# Patient Record
Sex: Female | Born: 1965
Health system: Southern US, Community
[De-identification: ages and names within clinical notes are randomized; demographics above are authoritative.]

## PROBLEM LIST (undated history)

## (undated) DIAGNOSIS — F329 Major depressive disorder, single episode, unspecified: Secondary | ICD-10-CM

## (undated) DIAGNOSIS — F32A Depression, unspecified: Secondary | ICD-10-CM

## (undated) DIAGNOSIS — E119 Type 2 diabetes mellitus without complications: Secondary | ICD-10-CM

## (undated) DIAGNOSIS — I10 Essential (primary) hypertension: Secondary | ICD-10-CM

## (undated) HISTORY — PX: DIAGNOSTIC LAPAROSCOPY: SUR761

## (undated) HISTORY — DX: Essential (primary) hypertension: I10

---

## 1987-12-26 HISTORY — PX: DILATION AND CURETTAGE OF UTERUS: SHX78

## 1998-12-25 HISTORY — PX: OTHER SURGICAL HISTORY: SHX169

## 2001-12-25 LAB — HIV ANTIBODY (ROUTINE TESTING W REFLEX): HIV 1&2 Ab, 4th Generation: NEGATIVE

## 2007-01-17 ENCOUNTER — Ambulatory Visit: Payer: Self-pay | Admitting: Unknown Physician Specialty

## 2008-03-11 ENCOUNTER — Ambulatory Visit: Payer: Self-pay | Admitting: Unknown Physician Specialty

## 2009-03-12 ENCOUNTER — Ambulatory Visit: Payer: Self-pay | Admitting: Unknown Physician Specialty

## 2010-03-16 ENCOUNTER — Other Ambulatory Visit: Payer: Self-pay | Admitting: Physician Assistant

## 2010-03-30 ENCOUNTER — Ambulatory Visit: Payer: Self-pay | Admitting: Unknown Physician Specialty

## 2010-07-15 ENCOUNTER — Ambulatory Visit: Payer: Self-pay | Admitting: General Practice

## 2012-03-27 ENCOUNTER — Encounter: Payer: Self-pay | Admitting: Internal Medicine

## 2012-03-27 ENCOUNTER — Ambulatory Visit (INDEPENDENT_AMBULATORY_CARE_PROVIDER_SITE_OTHER): Payer: PRIVATE HEALTH INSURANCE | Admitting: Internal Medicine

## 2012-03-27 VITALS — BP 136/88 | HR 100 | Temp 98.0°F | Resp 16 | Ht 64.0 in | Wt 273.2 lb

## 2012-03-27 DIAGNOSIS — Z9189 Other specified personal risk factors, not elsewhere classified: Secondary | ICD-10-CM | POA: Insufficient documentation

## 2012-03-27 DIAGNOSIS — I10 Essential (primary) hypertension: Secondary | ICD-10-CM

## 2012-03-27 DIAGNOSIS — E669 Obesity, unspecified: Secondary | ICD-10-CM

## 2012-03-27 DIAGNOSIS — Z1239 Encounter for other screening for malignant neoplasm of breast: Secondary | ICD-10-CM

## 2012-03-27 MED ORDER — LISINOPRIL-HYDROCHLOROTHIAZIDE 10-12.5 MG PO TABS: 1.0000 | ORAL_TABLET | Freq: Every day | ORAL | Status: DC

## 2012-03-27 MED ORDER — FLUOXETINE HCL 20 MG PO CAPS: 20.0000 mg | ORAL_CAPSULE | Freq: Every day | ORAL | Status: DC

## 2012-03-27 NOTE — Assessment & Plan Note (Signed)
I have addressed  BMI and recommended a low glycemic index diet utilizing smaller more frequent meals to increase metabolism.  I have also recommended that patient start exercising with a goal of 30 minutes of aerobic exercise a minimum of 5 days per week. Screening for lipid disorders, thyroid and diabetes to be done today.   

## 2012-03-27 NOTE — Assessment & Plan Note (Signed)
Aggravated by obesity.  Resuming lisinopril/hctz.  Repeat BMET one week

## 2012-03-27 NOTE — Progress Notes (Signed)
Patient ID: Laura Gray, female   DOB: Nov 29, 1966, 46 y.o.   MRN: [id number]  Patient Active Problem List  Diagnoses  . Hypertension  . Obesity (BMI 30-39.9)  . DES exposure in utero    Subjective:  CC:   Chief Complaint  Patient presents with  . New Patient    HPI:   Laura Gray a 46 y.o. female who presents   Who is here to establish primary care.  She is an ER nurse x 15 yrs.  Has had blood pressure issues x 2 yrs, treated in the past by [facility]  's Dr. Terance Hart for one year bc of headaches accompanying rhe bp issues.  Took lisinopril/hct ,  With weght loss the bp imporved so meds were stopped and headaches returned and she was put on propranolol for the headaches whic made her miserable.  Has been off of meds for one month.  Having headaches about once or twice week,  Occipital/neck and over left eye.  Up to date on eye exams.,  Carries tension in her neck .  Employee labs in October were normal except for cholesterol .  Has history of DES exposure,  recieves quad PAPs for cervicla Ca screening, last one 2011 by Ar[facility].    Past Medical History  Diagnosis Date  . Hypertension     Past Surgical History  Procedure Date  . Dilation and curettage of uterus 1989    miscarriage  . Exploratory 2000    x 2, ectopic pregnancy x 2, one rupture         The following portions of the patient's history were reviewed and updated as appropriate: Allergies, current medications, and problem list.    Review of Systems:   12 Pt  review of systems was negative except those addressed in the HPI,     History   Social History  . Marital Status: Divorced    Spouse Name: N/A    Number of Children: N/A  . Years of Education: N/A   Occupational History  . Not on file.   Social History Main Topics  . Smoking status: Never Smoker   . Smokeless tobacco: Never Used  . Alcohol Use: 0.6 oz/week    1 Glasses of wine per week  . Drug Use: No  . Sexually Active: Not on file     Other Topics Concern  . Not on file   Social History Narrative  . No narrative on file    Objective:  BP 136/88  Pulse 100  Temp(Src) 98 F (36.7 C) (Oral)  Resp 16  Ht 5\' 4"  (1.626 m)  Wt 273 lb 4 oz (123.945 kg)  BMI 46.90 kg/m2  SpO2 97%  LMP 01/28/2012  General appearance: alert, cooperative and appears stated age Ears: normal TM's and external ear canals both ears Throat: lips, mucosa, and tongue normal; teeth and gums normal Neck: no adenopathy, no carotid bruit, supple, symmetrical, trachea midline and thyroid not enlarged, symmetric, no tenderness/mass/nodules Back: symmetric, no curvature. ROM normal. No CVA tenderness. Lungs: clear to auscultation bilaterally Heart: regular rate and rhythm, S1, S2 normal, no murmur, click, rub or gallop Abdomen: soft, non-tender; bowel sounds normal; no masses,  no organomegaly Pulses: 2+ and symmetric Skin: Skin color, texture, turgor normal. No rashes or lesions Lymph nodes: Cervical, supraclavicular, and axillary nodes normal.  Assessment and Plan:  Hypertension Aggravated by obesity.  Resuming lisinopril/hctz.  Repeat BMET one week  Obesity (BMI 30-39.9) I have addressed  BMI and  recommended a low glycemic index diet utilizing smaller more frequent meals to increase metabolism.  I have also recommended that patient start exercising with a goal of 30 minutes of aerobic exercise a minimum of 5 days per week. Screening for lipid disorders, thyroid and diabetes to be done today.    DES exposure in utero Awaiting records from GYN,  Will need PAP in 6 months    Updated Medication List Outpatient Encounter Prescriptions as of 03/27/2012  Medication Sig Dispense Refill  . FLUoxetine (PROZAC) 20 MG capsule Take 1 capsule (20 mg total) by mouth daily. Take an additional dose on all weekend days  Keep on file for future refills  38 capsule  11  . lisinopril-hydrochlorothiazide (PRINZIDE,ZESTORETIC) 10-12.5 MG per tablet Take  1 tablet by mouth daily.  90 tablet  3  . Multiple Vitamin (MULTIVITAMIN) tablet Take 1 tablet by mouth daily.      Marland Kitchen DISCONTD: FLUoxetine (PROZAC) 20 MG capsule Take 20 mg by mouth daily.         Orders Placed This Encounter  Procedures  . MM Digital Screening    No Follow-up on file.

## 2012-03-27 NOTE — Assessment & Plan Note (Signed)
Awaiting records from GYN,  Will need PAP in 6 months

## 2012-04-30 ENCOUNTER — Ambulatory Visit: Payer: Self-pay | Admitting: Internal Medicine

## 2012-04-30 LAB — HM MAMMOGRAPHY: HM Mammogram: NORMAL

## 2012-05-07 ENCOUNTER — Telehealth: Payer: Self-pay | Admitting: Internal Medicine

## 2012-05-07 ENCOUNTER — Encounter: Payer: Self-pay | Admitting: Internal Medicine

## 2012-05-07 NOTE — Telephone Encounter (Signed)
Patient notified her mammogram is normal.

## 2012-05-10 ENCOUNTER — Encounter: Payer: Self-pay | Admitting: Internal Medicine

## 2012-09-26 ENCOUNTER — Ambulatory Visit (INDEPENDENT_AMBULATORY_CARE_PROVIDER_SITE_OTHER): Payer: PRIVATE HEALTH INSURANCE | Admitting: Internal Medicine

## 2012-09-26 ENCOUNTER — Encounter: Payer: Self-pay | Admitting: Internal Medicine

## 2012-09-26 ENCOUNTER — Other Ambulatory Visit (HOSPITAL_COMMUNITY)
Admission: RE | Admit: 2012-09-26 | Discharge: 2012-09-26 | Disposition: A | Discharge status: Home / Self Care | Payer: PRIVATE HEALTH INSURANCE | Source: Ambulatory Visit | Attending: Internal Medicine | Admitting: Internal Medicine

## 2012-09-26 VITALS — BP 108/68 | HR 73 | Temp 98.3°F | Ht 64.0 in | Wt 269.0 lb

## 2012-09-26 DIAGNOSIS — Z01419 Encounter for gynecological examination (general) (routine) without abnormal findings: Secondary | ICD-10-CM | POA: Insufficient documentation

## 2012-09-26 DIAGNOSIS — Z1151 Encounter for screening for human papillomavirus (HPV): Secondary | ICD-10-CM | POA: Insufficient documentation

## 2012-09-26 DIAGNOSIS — Z0000 Encounter for general adult medical examination without abnormal findings: Secondary | ICD-10-CM | POA: Insufficient documentation

## 2012-09-26 LAB — HM PAP SMEAR: HM Pap smear: NORMAL

## 2012-09-26 NOTE — Progress Notes (Signed)
Patient ID: Laura Gray, female   DOB: 1966/03/13, 46 y.o.   MRN: [id number] Subjective:     Laura Gray is a 46 y.o. female here for a routine exam.  Current complaints: irregular periods.  Personal health questionnaire reviewed: no.   Gynecologic History Patient's last menstrual period was 08/21/2012. Contraception: none Last Pap: 2 years ago. Results were: normal Last mammogram: May 2013 . Results were: normal  Obstetric History OB History    Grav Para Term Preterm Abortions TAB SAB Ect Mult Living                   The following portions of the patient's history were reviewed and updated as appropriate: allergies, current medications, past family history, past medical history, past social history, past surgical history and problem list.  Review of Systems A comprehensive review of systems was negative.    Objective:    BP 108/68  Pulse 73  Temp 98.3 F (36.8 C) (Oral)  Ht 5\' 4"  (1.626 m)  Wt 269 lb (122.018 kg)  BMI 46.17 kg/m2  SpO2 97%  LMP 08/21/2012  General Appearance:    Alert, cooperative, no distress, appears stated age  Head:    Normocephalic, without obvious abnormality, atraumatic  Eyes:    PERRL, conjunctiva/corneas clear, EOM's intact, fundi    benign, both eyes  Ears:    Normal TM's and external ear canals, both ears  Nose:   Nares normal, septum midline, mucosa normal, no drainage    or sinus tenderness  Throat:   Lips, mucosa, and tongue normal; teeth and gums normal  Neck:   Supple, symmetrical, trachea midline, no adenopathy;    thyroid:  no enlargement/tenderness/nodules; no carotid   bruit or JVD  Back:     Symmetric, no curvature, ROM normal, no CVA tenderness  Lungs:     Clear to auscultation bilaterally, respirations unlabored  Chest Wall:    No tenderness or deformity   Heart:    Regular rate and rhythm, S1 and S2 normal, no murmur, rub   or gallop  Breast Exam:    No tenderness, masses, or nipple abnormality  Abdomen:     Soft,  non-tender, bowel sounds active all four quadrants,    no masses, no organomegaly  Genitalia:    Normal female without lesion, discharge or tenderness     Extremities:   Extremities normal, atraumatic, no cyanosis or edema  Pulses:   2+ and symmetric all extremities  Skin:   Skin color, texture, turgor normal, no rashes or lesions  Lymph nodes:   Cervical, supraclavicular, and axillary nodes normal  Neurologic:   CNII-XII intact, normal strength, sensation and reflexes    throughout      Assessment:    Healthy female exam.    Plan:    Education reviewed: low fat, low cholesterol diet.

## 2012-09-27 NOTE — Assessment & Plan Note (Signed)
Annual pelvic was done due to history of DES exposure in utero.

## 2013-01-31 ENCOUNTER — Emergency Department: Payer: Self-pay | Admitting: Emergency Medicine

## 2013-06-04 ENCOUNTER — Other Ambulatory Visit: Payer: Self-pay | Admitting: *Deleted

## 2013-06-04 DIAGNOSIS — I10 Essential (primary) hypertension: Secondary | ICD-10-CM

## 2013-06-04 NOTE — Telephone Encounter (Signed)
Does she need followup or lab work prior to refill.? Last visit 10/13.

## 2013-06-05 MED ORDER — LISINOPRIL-HYDROCHLOROTHIAZIDE 10-12.5 MG PO TABS: 1.0000 | ORAL_TABLET | Freq: Every day | ORAL | Status: DC

## 2013-06-05 NOTE — Telephone Encounter (Signed)
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

## 2013-06-05 NOTE — Telephone Encounter (Signed)
Left message for pt to return my call.

## 2013-06-05 NOTE — Telephone Encounter (Signed)
OV and labwork appointment scheduled. Rx escripted to pharmacy for 1 month refill. Any other labs besides CBC, CMET, lipid panel, TSH and I will order them.

## 2013-06-16 ENCOUNTER — Telehealth: Payer: Self-pay | Admitting: *Deleted

## 2013-06-16 DIAGNOSIS — E669 Obesity, unspecified: Secondary | ICD-10-CM

## 2013-06-16 DIAGNOSIS — R5383 Other fatigue: Secondary | ICD-10-CM

## 2013-06-16 NOTE — Telephone Encounter (Signed)
Pt is coming in for labs tomorrow, what labs and dx?  

## 2013-06-17 ENCOUNTER — Other Ambulatory Visit (INDEPENDENT_AMBULATORY_CARE_PROVIDER_SITE_OTHER): Payer: 59

## 2013-06-17 DIAGNOSIS — R5381 Other malaise: Secondary | ICD-10-CM

## 2013-06-17 DIAGNOSIS — E669 Obesity, unspecified: Secondary | ICD-10-CM

## 2013-06-17 LAB — CBC WITH DIFFERENTIAL/PLATELET
Basophils Relative: 0.7 % (ref 0.0–3.0)
Eosinophils Absolute: 0.1 10*3/uL (ref 0.0–0.7)
Lymphocytes Relative: 36.3 % (ref 12.0–46.0)
MCHC: 34.7 g/dL (ref 30.0–36.0)
Monocytes Relative: 6.3 % (ref 3.0–12.0)
Neutrophils Relative %: 55.6 % (ref 43.0–77.0)
RBC: 4.24 Mil/uL (ref 3.87–5.11)
WBC: 7.3 10*3/uL (ref 4.5–10.5)

## 2013-06-17 LAB — COMPREHENSIVE METABOLIC PANEL
Albumin: 3.7 g/dL (ref 3.5–5.2)
BUN: 16 mg/dL (ref 6–23)
CO2: 27 mEq/L (ref 19–32)
Calcium: 9.2 mg/dL (ref 8.4–10.5)
Chloride: 102 mEq/L (ref 96–112)
Creatinine, Ser: 0.9 mg/dL (ref 0.4–1.2)
GFR: 71.31 mL/min (ref >60.00)
Glucose, Bld: 128 mg/dL — ABNORMAL HIGH (ref 70–99)
Potassium: 3.7 mEq/L (ref 3.5–5.1)

## 2013-06-17 LAB — LIPID PANEL
Cholesterol: 223 mg/dL — ABNORMAL HIGH (ref 0–200)
HDL: 55.6 mg/dL (ref >39.00)
Total CHOL/HDL Ratio: 4
Triglycerides: 145 mg/dL (ref 0.0–149.0)

## 2013-06-17 NOTE — Telephone Encounter (Signed)
Fasting labs ordered

## 2013-06-18 ENCOUNTER — Ambulatory Visit (INDEPENDENT_AMBULATORY_CARE_PROVIDER_SITE_OTHER): Payer: 59 | Admitting: Internal Medicine

## 2013-06-18 ENCOUNTER — Encounter: Payer: Self-pay | Admitting: Internal Medicine

## 2013-06-18 VITALS — BP 108/78 | HR 89 | Temp 98.4°F | Resp 14 | Wt 286.5 lb

## 2013-06-18 DIAGNOSIS — I10 Essential (primary) hypertension: Secondary | ICD-10-CM

## 2013-06-18 DIAGNOSIS — E669 Obesity, unspecified: Secondary | ICD-10-CM

## 2013-06-18 DIAGNOSIS — F411 Generalized anxiety disorder: Secondary | ICD-10-CM

## 2013-06-18 MED ORDER — LISINOPRIL-HYDROCHLOROTHIAZIDE 10-12.5 MG PO TABS: 1.0000 | ORAL_TABLET | Freq: Every day | ORAL | Status: DC

## 2013-06-18 MED ORDER — FLUOXETINE HCL 20 MG PO CAPS: ORAL_CAPSULE | ORAL | Status: DC

## 2013-06-18 NOTE — Patient Instructions (Addendum)
Your fasting glucose was 128.  This means you technically have DM type 2.  I want you to lose 12 lbs (minimum) beofre your 3 month follo wup   This is Dr Melina Schools version of a  "Low GI"  Diet:  It will  lower your blood sugars and allow you to lose 4 to 8  lbs  per month if you follow it carefully.  Your goal with exercise is a minimum of 30 minutes of aerobic exercise 5 days per week (Walking does not count once it becomes easy!)    All of the foods can be found at grocery stores and in bulk at [employer].  The Atkins protein bars and shakes are available in more varieties at Target, WalMart and [employer] Foods.     7 AM Breakfast:  Choose from the following:  Low carbohydrate Protein  Shakes (I recommend the EAS AdvantEdge "Carb Control" shakes  Or the low carb shakes by Atkins.    2.5 carbs   Arnold's "Sandwhich Thin"toasted  w/ peanut butter (no jelly: about 20 net carbs  "Bagel Thin" with cream cheese and salmon: about 20 carbs   a scrambled egg/bacon/cheese burrito made with Mission's "carb balance" whole wheat tortilla  (about 10 net carbs )   Avoid cereal and bananas, oatmeal and cream of wheat and grits. They are loaded with carbohydrates!   10 AM: high protein snack  Protein bar by Atkins (the snack size, under 200 cal, usually < 6 net carbs).    A stick of cheese:  Around 1 carb,  100 cal     Dannon Light n Fit [place] Yogurt  (80 cal, 8 carbs)  Other so called "protein bars" and Greek yogurts tend to be loaded with carbohydrates.  Remember, in food advertising, the word "energy" is synonymous for " carbohydrate."  Lunch:   A Sandwich using the bread choices listed, Can use any  Eggs,  lunchmeat, grilled meat or canned tuna), avocado, regular mayo/mustard  and cheese.  A Salad using blue cheese, ranch,  Goddess or vinagrette,  No croutons or "confetti" and no "candied nuts" but regular nuts OK.   No pretzels or chips.  Pickles and miniature sweet peppers are a good low carb alternative  that provide a "crunch"  The bread is the only source of carbohydrate in a sandwich and  can be decreased by trying some of these alternatives to traditional loaf bread  Joseph's makes a pita bread and a flat bread that are 50 cal and 4 net carbs available at BJs and WalMart.  This can be toasted to use with hummous as well  Toufayan makes a low carb flatbread that's 100 cal and 9 net carbs available at [employer] and [employer] makes 2 sizes of  Low carb whole wheat tortilla  (The large one is 210 cal and 6 net carbs) Avoid "Low fat dressings, as well as Reyne Dumas and [address] dressings They are loaded with sugar!   3 PM/ Mid day  Snack:  Consider  1 ounce of  almonds, walnuts, pistachios, pecans, peanuts,  Macadamia nuts or a nut medley.  Avoid "granola"; the dried cranberries and raisins are loaded with carbohydrates. Mixed nuts as long as there are no raisins,  cranberries or dried fruit.     6 PM  Dinner:     Meat/fowl/fish with a green salad, and either broccoli, cauliflower, green beans, spinach, brussel sprouts or  Lima beans. DO NOT BREAD THE  PROTEIN!!      There is a low carb pasta by Dreamfield's that is acceptable and tastes great: only 5 digestible carbs/serving.( All grocery stores but BJs carry it )  Try Kai Levins Angelo's chicken piccata or chicken or eggplant parm over low carb pasta.(Lowes and BJs)   Clifton Custard Sanchez's "Carnitas" (pulled pork, no sauce,  0 carbs) or his beef pot roast to make a dinner burrito (at BJ's)  Pesto over low carb pasta (bj's sells a good quality pesto in the center refrigerated section of the deli   Whole wheat pasta is still full of digestible carbs and  Not as low in glycemic index as Dreamfield's.   Brown rice is still rice,  So skip the rice and noodles if you eat [place] or [place] (or at least limit to 1/2 cup)  9 PM snack :   Breyer's "low carb" fudgsicle or  ice cream bar (Carb Smart line), or  Weight Watcher's ice cream bar , or another  "no sugar added" ice cream;  a serving of fresh berries/cherries with whipped cream   Cheese or DANNON'S LlGHT N FIT [place] YOGURT  Avoid bananas, pineapple, grapes  and watermelon on a regular basis because they are high in sugar.  THINK OF THEM AS DESSERT  Remember that snack Substitutions should be less than 10 NET carbs per serving and meals < 20 carbs. Remember to subtract fiber grams to get the "net carbs."  Return in 3 months for A1c and fasting labs

## 2013-06-18 NOTE — Progress Notes (Addendum)
Patient ID: Laura Gray, female   DOB: 03/17/1966, 47 y.o.   MRN: [id number]  Patient Active Problem List   Diagnosis Date Noted  . Generalized anxiety disorder 06/19/2013  . Routine general medical examination at a health care facility 09/26/2012  . Hypertension 03/27/2012  . Obesity (BMI 30-39.9) 03/27/2012  . DES exposure in utero 03/27/2012    Subjective:  CC:   Chief Complaint  Patient presents with  . Follow-up    8 months last ov    HPI:   Laura Gray a 47 y.o. female who presents 6 month follow up on chronic conditions including hypertension obesity and hyperlipidemia.   1) She has been out of work after sustaining a right ankle fracture, nondisplace spiral  Treated with casting, no surgery, in early Feb .   Was off feet and out of work for 10 weeks on [employer] it has healed. Some swelling noted still but no apin .   2) new onset back pain.  Muscle spasm two weeks ago in lower back.  Took a massage which helped. Problem was on the right side,   3) Obesity,  Gained 17 LBS since last visit bc of inactivity    4) anger /anxiety, the wellbutrin aggravated her symptoms.    4) amenorrhea, secondary for the past 6 months, has resolved with her first meses which occurred last week.  Not particularly heavy or painful.    Past Medical History  Diagnosis Date  . Hypertension     Past Surgical History  Procedure Laterality Date  . Dilation and curettage of uterus  1989    miscarriage  . Exploratory  2000    x 2, ectopic pregnancy x 2, one rupture       The following portions of the patient's history were reviewed and updated as appropriate: Allergies, current medications, and problem list.    Review of Systems:   12 Pt  review of systems was negative except those addressed in the HPI,     History   Social History  . Marital Status: Divorced    Spouse Name: N/A    Number of Children: N/A  . Years of Education: N/A   Occupational History   . Not on file.   Social History Main Topics  . Smoking status: Never Smoker   . Smokeless tobacco: Never Used  . Alcohol Use: 0.6 oz/week    1 Glasses of wine per week  . Drug Use: No  . Sexually Active: Not on file   Other Topics Concern  . Not on file   Social History Narrative  . No narrative on file    Objective:  BP 108/78  Pulse 89  Temp(Src) 98.4 F (36.9 C) (Oral)  Resp 14  Wt 286 lb 8 oz (129.956 kg)  BMI 49.15 kg/m2  SpO2 99%  LMP 06/12/2013  General appearance: alert, cooperative and appears stated age Ears: normal TM's and external ear canals both ears Throat: lips, mucosa, and tongue normal; teeth and gums normal Neck: no adenopathy, no carotid bruit, supple, symmetrical, trachea midline and thyroid not enlarged, symmetric, no tenderness/mass/nodules Back: symmetric, no curvature. ROM normal. No CVA tenderness. Lungs: clear to auscultation bilaterally Heart: regular rate and rhythm, S1, S2 normal, no murmur, click, rub or gallop Abdomen: soft, non-tender; bowel sounds normal; no masses,  no organomegaly Pulses: 2+ and symmetric Skin: Skin color, texture, turgor normal. No rashes or lesions Lymph nodes: Cervical, supraclavicular, and axillary nodes normal.  Assessment and Plan:  Generalized anxiety disorder Better controled with Prozac.  No changes today   Hypertension Well controlled on current regimen. Renal function stable, no changes today.  Obesity (BMI 30-39.9) I have addressed  BMI and recommended a low glycemic index diet utilizing smaller more frequent meals to increase metabolism.  I have also recommended that patient start exercising with a goal of 30 minutes of aerobic exercise a minimum of 5 days per week. Screening for lipid disorders, thyroid and diabetes today notes a fasting glucose of 128 in the setting of weight gain and inaitvity.  Will repeat in 3 to 6 months with an a1c .   A total of 40 minutes was spent with patient more than  half of which was spent in counseling, reviewing records from other prviders and coordination of care.   Updated Medication List Outpatient Encounter Prescriptions as of 06/18/2013  Medication Sig Dispense Refill  . cetirizine (ZYRTEC) 10 MG tablet Take 10 mg by mouth daily.      Marland Kitchen FLUoxetine (PROZAC) 20 MG capsule Take an additional dose on all weekend days  114 capsule  3  . lisinopril-hydrochlorothiazide (PRINZIDE,ZESTORETIC) 10-12.5 MG per tablet Take 1 tablet by mouth daily.  90 tablet  3  . Multiple Vitamin (MULTIVITAMIN) tablet Take 1 tablet by mouth daily.      . [DISCONTINUED] FLUoxetine (PROZAC) 20 MG capsule Take 1 capsule (20 mg total) by mouth daily. Take an additional dose on all weekend days  Keep on file for future refills  38 capsule  11  . [DISCONTINUED] lisinopril-hydrochlorothiazide (PRINZIDE,ZESTORETIC) 10-12.5 MG per tablet Take 1 tablet by mouth daily.  30 tablet  0   No facility-administered encounter medications on file as of 06/18/2013.     No orders of the defined types were placed in this encounter.    No Follow-up on file.

## 2013-06-19 DIAGNOSIS — F411 Generalized anxiety disorder: Secondary | ICD-10-CM | POA: Insufficient documentation

## 2013-06-19 NOTE — Assessment & Plan Note (Signed)
Better controled with Prozac.  No changes today

## 2013-06-19 NOTE — Assessment & Plan Note (Signed)
I have addressed  BMI and recommended a low glycemic index diet utilizing smaller more frequent meals to increase metabolism.  I have also recommended that patient start exercising with a goal of 30 minutes of aerobic exercise a minimum of 5 days per week. Screening for lipid disorders, thyroid and diabetes today notes a fasting glucose of 128 in the setting of weight gain and inaitvity.  Will repeat in 3 to 6 months with an a1c .

## 2013-06-19 NOTE — Assessment & Plan Note (Signed)
Well controlled on current regimen. Renal function stable, no changes today. 

## 2013-09-11 ENCOUNTER — Encounter: Payer: Self-pay | Admitting: Internal Medicine

## 2013-09-18 ENCOUNTER — Encounter: Payer: Self-pay | Admitting: Internal Medicine

## 2013-09-18 ENCOUNTER — Ambulatory Visit (INDEPENDENT_AMBULATORY_CARE_PROVIDER_SITE_OTHER): Payer: 59 | Admitting: Internal Medicine

## 2013-09-18 VITALS — BP 118/80 | HR 74 | Temp 98.7°F | Resp 14 | Ht 64.0 in | Wt 264.2 lb

## 2013-09-18 DIAGNOSIS — R739 Hyperglycemia, unspecified: Secondary | ICD-10-CM

## 2013-09-18 DIAGNOSIS — E785 Hyperlipidemia, unspecified: Secondary | ICD-10-CM

## 2013-09-18 DIAGNOSIS — IMO0001 Reserved for inherently not codable concepts without codable children: Secondary | ICD-10-CM

## 2013-09-18 DIAGNOSIS — E119 Type 2 diabetes mellitus without complications: Secondary | ICD-10-CM

## 2013-09-18 DIAGNOSIS — N926 Irregular menstruation, unspecified: Secondary | ICD-10-CM

## 2013-09-18 DIAGNOSIS — E669 Obesity, unspecified: Secondary | ICD-10-CM

## 2013-09-18 DIAGNOSIS — Z1239 Encounter for other screening for malignant neoplasm of breast: Secondary | ICD-10-CM

## 2013-09-18 DIAGNOSIS — S82891D Other fracture of right lower leg, subsequent encounter for closed fracture with routine healing: Secondary | ICD-10-CM

## 2013-09-18 DIAGNOSIS — R7309 Other abnormal glucose: Secondary | ICD-10-CM

## 2013-09-18 DIAGNOSIS — I10 Essential (primary) hypertension: Secondary | ICD-10-CM

## 2013-09-18 DIAGNOSIS — Z0000 Encounter for general adult medical examination without abnormal findings: Secondary | ICD-10-CM

## 2013-09-18 LAB — COMPREHENSIVE METABOLIC PANEL
Albumin: 4.1 g/dL (ref 3.5–5.2)
BUN: 15 mg/dL (ref 6–23)
Calcium: 9.7 mg/dL (ref 8.4–10.5)
Chloride: 102 mEq/L (ref 96–112)
Glucose, Bld: 103 mg/dL — ABNORMAL HIGH (ref 70–99)
Potassium: 3.7 mEq/L (ref 3.5–5.1)
Total Bilirubin: 0.6 mg/dL (ref 0.3–1.2)
Total Protein: 7.9 g/dL (ref 6.0–8.3)

## 2013-09-18 LAB — MICROALBUMIN / CREATININE URINE RATIO
Microalb Creat Ratio: 0.2 mg/g (ref 0.0–30.0)
Microalb, Ur: 0.3 mg/dL (ref 0.0–1.9)

## 2013-09-18 LAB — LIPID PANEL
Cholesterol: 218 mg/dL — ABNORMAL HIGH (ref 0–200)
HDL: 55.9 mg/dL (ref >39.00)
Total CHOL/HDL Ratio: 4
VLDL: 21.4 mg/dL (ref 0.0–40.0)

## 2013-09-18 LAB — LDL CHOLESTEROL, DIRECT: Direct LDL: 145.6 mg/dL

## 2013-09-18 NOTE — Progress Notes (Signed)
Patient ID: Laura Gray, female   DOB: [birth year], 47 y.o.   MRN: [id number]  Patient Active Problem List   Diagnosis Date Noted  . Other and unspecified hyperlipidemia 09/20/2013  . Generalized anxiety disorder 06/19/2013  . Routine general medical examination at a health care facility 09/26/2012  . Hypertension 03/27/2012  . Obesity (BMI 30-39.9) 03/27/2012  . DES exposure in utero 03/27/2012    Subjective:  CC:   Chief Complaint  Patient presents with  . Follow-up    3 month    HPI:   Laura Gray is a 47 y.o. female who presents for 3 month follow up on hypertension, elevated fasting glucose of 128  and obesity. She has lost  22 lbs on low GI diet.  Her blood pressure has been well controlled. She has been having menstrual irregularity.  She had 3 months  of normal menses,  June throuhg August, then none this month.   She is not sexually active.  No hot flashes.    Past Medical History  Diagnosis Date  . Hypertension     Past Surgical History  Procedure Laterality Date  . Dilation and curettage of uterus  1989    miscarriage  . Exploratory  2000    x 2, ectopic pregnancy x 2, one rupture    The following portions of the patient's history were reviewed and updated as appropriate: Allergies, current medications, and problem list.    Review of Systems:   12 Pt  review of systems was negative except those addressed in the HPI,     History   Social History  . Marital Status: Divorced    Spouse Name: N/A    Number of Children: N/A  . Years of Education: N/A   Occupational History  . Not on file.   Social History Main Topics  . Smoking status: Never Smoker   . Smokeless tobacco: Never Used  . Alcohol Use: 0.6 oz/week    1 Glasses of wine per week  . Drug Use: No  . Sexual Activity: Yes   Other Topics Concern  . Not on file   Social History Narrative  . No narrative on file    Objective:  Filed Vitals:   09/18/13 1042  BP: 118/80  Pulse: 74   Temp: 98.7 F (37.1 C)  Resp: 14     General appearance: alert, cooperative and appears stated age Neck: no adenopathy, no carotid bruit, supple, symmetrical, trachea midline and thyroid not enlarged, symmetric, no tenderness/mass/nodules Back: symmetric, no curvature. ROM normal. No CVA tenderness. Lungs: clear to auscultation bilaterally Heart: regular rate and rhythm, S1, S2 normal, no murmur, click, rub or gallop Abdomen: soft, non-tender; bowel sounds normal; no masses,  no organomegaly Pulses: 2+ and symmetric Skin: Skin color, texture, turgor normal. No rashes or lesions Lymph nodes: Cervical, supraclavicular, and axillary nodes normal.  Assessment and Plan:  Obesity (BMI 30-39.9) She has lost 17 lbs in the last 3 months following the low GI diet I gave her. I have congratulated her and  Encouraged her to lose another  10% of body weight over the next 6 months using the low glycemic index diet and regular exercise a minimum of 5 days per week.  She had a fasting glucose of 128 3 months ago, but her A1c is 5.2 and repeat FBG is 103 with wt loss and diet.    Other and unspecified hyperlipidemia LDL is above goal.  Statin initiation was recommended but she would like  to give diet another 6 months and repeat labs.   Hypertension Well controlled on current regimen. Renal function stable, no changes today.  Irregular menstrual cycle Etiology may be perimenopause vs PCOS.  She is not sexually active so pregnancy unlikely.  Without hot flashes or other sifns of menopause.  NO workup at this time.   Screening for breast cancer Mammogram ordered/    Updated Medication List Outpatient Encounter Prescriptions as of 09/18/2013  Medication Sig Dispense Refill  . cetirizine (ZYRTEC) 10 MG tablet Take 10 mg by mouth daily.      Marland Kitchen FLUoxetine (PROZAC) 20 MG capsule Take an additional dose on all weekend days  114 capsule  3  . lisinopril-hydrochlorothiazide (PRINZIDE,ZESTORETIC)  10-12.5 MG per tablet Take 1 tablet by mouth daily.  90 tablet  3  . Multiple Vitamin (MULTIVITAMIN) tablet Take 1 tablet by mouth daily.       No facility-administered encounter medications on file as of 09/18/2013.

## 2013-09-18 NOTE — Patient Instructions (Addendum)
You are doing great on the weight loss!  We are screening you for DM today since your fasting glucose was 128 3 months ago

## 2013-09-19 ENCOUNTER — Encounter: Payer: Self-pay | Admitting: Internal Medicine

## 2013-09-20 ENCOUNTER — Encounter: Payer: Self-pay | Admitting: Internal Medicine

## 2013-09-20 DIAGNOSIS — Z1239 Encounter for other screening for malignant neoplasm of breast: Secondary | ICD-10-CM | POA: Insufficient documentation

## 2013-09-20 DIAGNOSIS — E785 Hyperlipidemia, unspecified: Secondary | ICD-10-CM | POA: Insufficient documentation

## 2013-09-20 DIAGNOSIS — N911 Secondary amenorrhea: Secondary | ICD-10-CM | POA: Insufficient documentation

## 2013-09-20 DIAGNOSIS — S82891A Other fracture of right lower leg, initial encounter for closed fracture: Secondary | ICD-10-CM | POA: Insufficient documentation

## 2013-09-20 NOTE — Assessment & Plan Note (Signed)
Well controlled on current regimen. Renal function stable, no changes today. 

## 2013-09-20 NOTE — Assessment & Plan Note (Signed)
Mammogram ordered

## 2013-09-20 NOTE — Assessment & Plan Note (Signed)
Etiology may be perimenopauase vs PCOS.  She is not sexually active so pregnancy unlikely.  Without hot flashes or other sifns of menopause.  NO workup at this time.

## 2013-09-20 NOTE — Assessment & Plan Note (Signed)
LDL is above goal.  Statin initiation was recommended but she would like to give diet another 6 months and repeat labs.

## 2013-09-20 NOTE — Assessment & Plan Note (Addendum)
She has lost 17 lbs in the last 3 months following the low GI diet I gave her. I have congratulated her and  Encouraged her to lose another  10% of body weight over the next 6 months using the low glycemic index diet and regular exercise a minimum of 5 days per week.  She had a fasting glucose of 128 3 months ago, but her A1c is 5.2 and repeat FBG is 103 with wt loss and diet.

## 2013-10-01 LAB — HM MAMMOGRAPHY: HM Mammogram: NORMAL

## 2013-10-09 ENCOUNTER — Encounter: Payer: Self-pay | Admitting: Internal Medicine

## 2013-10-09 ENCOUNTER — Encounter: Payer: Self-pay | Admitting: Emergency Medicine

## 2013-10-21 ENCOUNTER — Ambulatory Visit: Payer: Self-pay | Admitting: Internal Medicine

## 2013-11-10 ENCOUNTER — Encounter: Payer: Self-pay | Admitting: Internal Medicine

## 2014-04-01 ENCOUNTER — Ambulatory Visit (INDEPENDENT_AMBULATORY_CARE_PROVIDER_SITE_OTHER): Payer: 59 | Admitting: Internal Medicine

## 2014-04-01 ENCOUNTER — Encounter: Payer: Self-pay | Admitting: Internal Medicine

## 2014-04-01 ENCOUNTER — Telehealth: Payer: Self-pay | Admitting: Internal Medicine

## 2014-04-01 VITALS — BP 102/60 | HR 68 | Temp 99.0°F | Resp 16 | Wt 274.0 lb

## 2014-04-01 DIAGNOSIS — E669 Obesity, unspecified: Secondary | ICD-10-CM

## 2014-04-01 DIAGNOSIS — I10 Essential (primary) hypertension: Secondary | ICD-10-CM

## 2014-04-01 DIAGNOSIS — Z79899 Other long term (current) drug therapy: Secondary | ICD-10-CM

## 2014-04-01 DIAGNOSIS — E785 Hyperlipidemia, unspecified: Secondary | ICD-10-CM

## 2014-04-01 LAB — COMPREHENSIVE METABOLIC PANEL
ALBUMIN: 3.5 g/dL (ref 3.5–5.2)
ALK PHOS: 64 U/L (ref 39–117)
ALT: 22 U/L (ref 0–35)
AST: 21 U/L (ref 0–37)
BILIRUBIN TOTAL: 0.4 mg/dL (ref 0.3–1.2)
BUN: 11 mg/dL (ref 6–23)
CO2: 27 mEq/L (ref 19–32)
Calcium: 9 mg/dL (ref 8.4–10.5)
Chloride: 102 mEq/L (ref 96–112)
Creatinine, Ser: 0.8 mg/dL (ref 0.4–1.2)
GFR: 83.83 mL/min (ref >60.00)
GLUCOSE: 120 mg/dL — AB (ref 70–99)
POTASSIUM: 3.6 meq/L (ref 3.5–5.1)
SODIUM: 137 meq/L (ref 135–145)
TOTAL PROTEIN: 6.9 g/dL (ref 6.0–8.3)

## 2014-04-01 MED ORDER — CYCLOBENZAPRINE HCL 5 MG PO TABS: 5.0000 mg | ORAL_TABLET | Freq: Three times a day (TID) | ORAL | Status: DC | PRN

## 2014-04-01 MED ORDER — PHENTERMINE HCL 37.5 MG PO TABS: 19.0000 mg | ORAL_TABLET | Freq: Two times a day (BID) | ORAL | Status: DC

## 2014-04-01 NOTE — Assessment & Plan Note (Signed)
Success, then regaining of 50% of wt.  Discussed diet, exercise regimen. . She has had difficulty losing weight due to increased appetite and is requesting a trial of  Phentermine.  She is aware of the possible side effects and risks and understands that the medication will be discontinued if she has not lost 5% of her body weight over the next 3 months, which , based on today's weight is 14 lbs.

## 2014-04-01 NOTE — Patient Instructions (Addendum)
We are starting medication to help you lose weight.  Our goal with phentermine use it a wt loss of 5% ( 14 lbs)  in 3 months or we will need to discontinue it due to the risk of addicition outweighing the benefits of continuing it.  Call if bp pulse becomes too elevated ( 140/80 or > 100)   Flexeril and core strengthening exercises for recurrent back strain.  Consider a one time PT evaluation    Phentermine tablets or capsules What is this medicine? PHENTERMINE (FEN ter meen) decreases your appetite. It is used with a reduced calorie diet and exercise to help you lose weight. This medicine may be used for other purposes; ask your health care provider or pharmacist if you have questions. COMMON BRAND NAME(S): Adipex-P, Atti-Plex P , Atti-Plex P Spansule , Fastin, Pro-Fast, Tara-8  What should I tell my health care provider before I take this medicine? They need to know if you have any of these conditions: -agitation -glaucoma -heart disease -high blood pressure -history of substance abuse -lung disease called Primary Pulmonary Hypertension (PPH) -taken an MAOI like Carbex, Eldepryl, Marplan, Nardil, or Parnate in last 14 days -thyroid disease -an unusual or allergic reaction to phentermine, other medicines, foods, dyes, or preservatives -pregnant or trying to get pregnant -breast-feeding How should I use this medicine? Take this medicine by mouth with a glass of water. Follow the directions on the prescription label. This medicine is usually taken 30 minutes before or 1 to 2 hours after breakfast. Avoid taking this medicine in the evening. It may interfere with sleep. Take your doses at regular intervals. Do not take your medicine more often than directed. Talk to your pediatrician regarding the use of this medicine in children. Special care may be needed. Overdosage: If you think you have taken too much of this medicine contact a poison control center or emergency room at once. NOTE:  This medicine is only for you. Do not share this medicine with others. What if I miss a dose? If you miss a dose, take it as soon as you can. If it is almost time for your next dose, take only that dose. Do not take double or extra doses. What may interact with this medicine? Do not take this medicine with any of the following medications: -duloxetine -MAOIs like Carbex, Eldepryl, Marplan, Nardil, and Parnate -medicines for colds or breathing difficulties like pseudoephedrine or phenylephrine -procarbazine -sibutramine -SSRIs like citalopram, escitalopram, fluoxetine, fluvoxamine, paroxetine, and sertraline -stimulants like dexmethylphenidate, methylphenidate or modafinil -venlafaxine This medicine may also interact with the following medications: -medicines for diabetes This list may not describe all possible interactions. Give your health care provider a list of all the medicines, herbs, non-prescription drugs, or dietary supplements you use. Also tell them if you smoke, drink alcohol, or use illegal drugs. Some items may interact with your medicine. What should I watch for while using this medicine? Notify your physician immediately if you become short of breath while doing your normal activities. Do not take this medicine within 6 hours of bedtime. It can keep you from getting to sleep. Avoid drinks that contain caffeine and try to stick to a regular bedtime every night. This medicine was intended to be used in addition to a healthy diet and exercise. The best results are achieved this way. This medicine is only indicated for short-term use. Eventually your weight loss may level out. At that point, the drug will only help you maintain your new weight. Do  not increase or in any way change your dose without consulting your doctor. You may get drowsy or dizzy. Do not drive, use machinery, or do anything that needs mental alertness until you know how this medicine affects you. Do not stand or sit  up quickly, especially if you are an older patient. This reduces the risk of dizzy or fainting spells. Alcohol may increase dizziness and drowsiness. Avoid alcoholic drinks. What side effects may I notice from receiving this medicine? Side effects that you should report to your doctor or health care professional as soon as possible: -chest pain, palpitations -depression or severe changes in mood -increased blood pressure -irritability -nervousness or restlessness -severe dizziness -shortness of breath -problems urinating -unusual swelling of the legs -vomiting Side effects that usually do not require medical attention (report to your doctor or health care professional if they continue or are bothersome): -blurred vision or other eye problems -changes in sexual ability or desire -constipation or diarrhea -difficulty sleeping -dry mouth or unpleasant taste -headache -nausea This list may not describe all possible side effects. Call your doctor for medical advice about side effects. You may report side effects to FDA at 1-800-FDA-1088. Where should I keep my medicine? Keep out of the reach of children. This medicine can be abused. Keep your medicine in a safe place to protect it from theft. Do not share this medicine with anyone. Selling or giving away this medicine is dangerous and against the law. Store at room temperature between 20 and 25 degrees C (68 and 77 degrees F). Keep container tightly closed. Throw away any unused medicine after the expiration date. NOTE: This sheet is a summary. It may not cover all possible information. If you have questions about this medicine, talk to your doctor, pharmacist, or health care provider.  2014, Elsevier/Gold Standard. (2011-01-25 11:02:44)

## 2014-04-01 NOTE — Assessment & Plan Note (Signed)
Mild.  Will recheck in 3 months after wt loss period.

## 2014-04-01 NOTE — Progress Notes (Signed)
Pre-visit discussion using our clinic review tool. No additional management support is needed unless otherwise documented below in the visit note.  

## 2014-04-01 NOTE — Telephone Encounter (Signed)
Relevant patient education assigned to patient using Emmi. ° °

## 2014-04-01 NOTE — Assessment & Plan Note (Signed)
Well controlled on current regimen. Renal function due, no changes today. Lab Results  Component Value Date   CREATININE 0.9 09/18/2013

## 2014-04-01 NOTE — Progress Notes (Signed)
Patient ID: Laura Gray, female   DOB: 02/15/1966, 48 y.o.   MRN: [id number] Patient Active Problem List   Diagnosis Date Noted  . Other and unspecified hyperlipidemia 09/20/2013  . Irregular menstrual cycle 09/20/2013  . Ankle fracture, right 09/20/2013  . Screening for breast cancer 09/20/2013  . Generalized anxiety disorder 06/19/2013  . Routine general medical examination at a health care facility 09/26/2012  . Hypertension 03/27/2012  . Obesity (BMI 30-39.9) 03/27/2012  . DES exposure in utero 03/27/2012    Subjective:  CC:   Chief Complaint  Patient presents with  . Follow-up    on cholesterol    HPI:   Laura Gray is a 48 y.o. female who presents for   6 month follow up on hyperlipidemia, hypertension and obesity.  She has gained 10 lbs over the last several months,  Due to discontuation of diet and lack of regular exercise since son became involved in his own sports. 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 Hypertension     Past Surgical History  Procedure Laterality Date  . Dilation and curettage of uterus  1989    miscarriage  . Exploratory  2000    x 2, ectopic pregnancy x 2, one rupture       The following portions of the patient's history were reviewed and updated as appropriate: Allergies, current medications, and problem list.    Review of Systems:   Patient denies headache, fevers, malaise, unintentional weight loss, skin rash, eye pain, sinus congestion and sinus pain, sore throat, dysphagia,  hemoptysis , cough, dyspnea, wheezing, chest pain, palpitations, orthopnea, edema, abdominal pain, nausea, melena, diarrhea, constipation, flank pain, dysuria, hematuria, urinary  Frequency, nocturia, numbness, tingling, seizures,  Focal weakness, Loss of consciousness,  Tremor, insomnia, depression, anxiety, and suicidal ideation.     History    Social History  . Marital Status: Divorced    Spouse Name: N/A    Number of Children: N/A  . Years of Education: N/A   Occupational History  . Not on file.   Social History Main Topics  . Smoking status: Never Smoker   . Smokeless tobacco: Never Used  . Alcohol Use: 0.6 oz/week    1 Glasses of wine per week  . Drug Use: No  . Sexual Activity: Yes    Birth Control/ Protection: Condom   Other Topics Concern  . Not on file   Social History Narrative  . No narrative on file    Objective:  Filed Vitals:   04/01/14 1028  BP: 102/60  Pulse: 68  Temp: 99 F (37.2 C)  Resp: 16     General appearance: alert, cooperative and appears stated age Ears: normal TM's and external ear canals both ears Throat: lips, mucosa, and tongue normal; teeth and gums normal Neck: no adenopathy, no carotid bruit, supple, symmetrical, trachea midline and thyroid not enlarged, symmetric, no tenderness/mass/nodules Back: symmetric, no curvature. ROM normal. No CVA tenderness. Lungs: clear to auscultation bilaterally Heart: regular rate and rhythm, S1, S2 normal, no murmur, click, rub or gallop Abdomen: soft, non-tender; bowel sounds normal; no masses,  no organomegaly Pulses: 2+ and symmetric Skin: Skin color, texture, turgor normal. No rashes or lesions Lymph nodes: Cervical, supraclavicular, and axillary nodes normal.  Assessment and Plan:  Obesity (BMI 30-39.9) Success, then regaining of 50% of wt.  Discussed diet, exercise regimen. Marland Kitchen  She has had difficulty losing weight due to increased appetite and is requesting a trial of  Phentermine.  She is aware of the possible side effects and risks and understands that the medication will be discontinued if she has not lost 5% of her body weight over the next 3 months, which , based on today's weight is 14 lbs.   Other and unspecified hyperlipidemia Mild.  Will recheck in 3 months after wt loss period.   Hypertension Well controlled on  current regimen. Renal function due, no changes today. Lab Results  Component Value Date   CREATININE 0.9 09/18/2013    A total of 30 minutes was spent with patient more than half of which was spent in counseling, reviewing records from other prviders and coordination of care.   Updated Medication List Outpatient Encounter Prescriptions as of 04/01/2014  Medication Sig  . cetirizine (ZYRTEC) 10 MG tablet Take 10 mg by mouth daily.  Marland Kitchen FLUoxetine (PROZAC) 20 MG capsule Take an additional dose on all weekend days  . lisinopril-hydrochlorothiazide (PRINZIDE,ZESTORETIC) 10-12.5 MG per tablet Take 1 tablet by mouth daily.  . Melatonin 1 MG CAPS Take 1 capsule by mouth at bedtime as needed.  . Multiple Vitamin (MULTIVITAMIN) tablet Take 1 tablet by mouth daily.  . cyclobenzaprine (FLEXERIL) 5 MG tablet Take 1 tablet (5 mg total) by mouth 3 (three) times daily as needed for muscle spasms.  . phentermine (ADIPEX-P) 37.5 MG tablet Take 0.5 tablets (18.75 mg total) by mouth 2 (two) times daily.     Orders Placed This Encounter  Procedures  . Comp Met (CMET)  . Comprehensive metabolic panel  . Lipid panel    Return in about 3 months (around 07/01/2014).

## 2014-04-02 ENCOUNTER — Encounter: Payer: Self-pay | Admitting: Internal Medicine

## 2014-07-01 ENCOUNTER — Ambulatory Visit (INDEPENDENT_AMBULATORY_CARE_PROVIDER_SITE_OTHER): Payer: 59 | Admitting: Internal Medicine

## 2014-07-01 ENCOUNTER — Encounter: Payer: Self-pay | Admitting: Internal Medicine

## 2014-07-01 VITALS — BP 124/76 | HR 76 | Temp 98.8°F | Resp 16 | Ht 64.0 in | Wt 260.5 lb

## 2014-07-01 DIAGNOSIS — E785 Hyperlipidemia, unspecified: Secondary | ICD-10-CM

## 2014-07-01 DIAGNOSIS — Z23 Encounter for immunization: Secondary | ICD-10-CM

## 2014-07-01 DIAGNOSIS — I10 Essential (primary) hypertension: Secondary | ICD-10-CM

## 2014-07-01 DIAGNOSIS — E669 Obesity, unspecified: Secondary | ICD-10-CM

## 2014-07-01 DIAGNOSIS — Z79899 Other long term (current) drug therapy: Secondary | ICD-10-CM

## 2014-07-01 MED ORDER — LISINOPRIL-HYDROCHLOROTHIAZIDE 10-12.5 MG PO TABS: 1.0000 | ORAL_TABLET | Freq: Every day | ORAL | Status: DC

## 2014-07-01 MED ORDER — FLUOXETINE HCL 20 MG PO CAPS: ORAL_CAPSULE | ORAL | Status: DC

## 2014-07-01 MED ORDER — PHENTERMINE HCL 37.5 MG PO TABS: 19.0000 mg | ORAL_TABLET | Freq: Two times a day (BID) | ORAL | Status: DC

## 2014-07-01 NOTE — Patient Instructions (Signed)
You are doing very well on the phentermine  We will continue it for 3 more months (goal is another  14 lbs)  If the weight starts to plateau, intensify or increase the exercise  You received the TDaP vaccine today

## 2014-07-01 NOTE — Progress Notes (Signed)
Patient ID: Laura Gray, female   DOB: 1966-09-11, 48 y.o.   MRN: [id number]   Patient Active Problem List   Diagnosis Date Noted  . Other and unspecified hyperlipidemia 09/20/2013  . Irregular menstrual cycle 09/20/2013  . Ankle fracture, right 09/20/2013  . Screening for breast cancer 09/20/2013  . Generalized anxiety disorder 06/19/2013  . Routine general medical examination at a health care facility 09/26/2012  . Hypertension 03/27/2012  . Obesity (BMI 30-39.9) 03/27/2012  . DES exposure in utero 03/27/2012    Subjective:  CC:   Chief Complaint  Patient presents with  . Follow-up  . Hypertension  . Weight Loss    From 274 to 260.8    HPI:   Laura Gray is a 48 y.o. female who presents for follow up on hypertension and obesity. With pharmacotherapy for weight loss.  She has been taking phentermine for 3 months and has lost 14 lbs. She has had no side effects.  She is walking daily at 6am with he daughter.    Past Medical History  Diagnosis Date  . Hypertension     Past Surgical History  Procedure Laterality Date  . Dilation and curettage of uterus  1989    miscarriage  . Exploratory  2000    x 2, ectopic pregnancy x 2, one rupture       The following portions of the patient's history were reviewed and updated as appropriate: Allergies, current medications, and problem list.    Review of Systems:   Patient denies headache, fevers, malaise, unintentional weight loss, skin rash, eye pain, sinus congestion and sinus pain, sore throat, dysphagia,  hemoptysis , cough, dyspnea, wheezing, chest pain, palpitations, orthopnea, edema, abdominal pain, nausea, melena, diarrhea, constipation, flank pain, dysuria, hematuria, urinary  Frequency, nocturia, numbness, tingling, seizures,  Focal weakness, Loss of consciousness,  Tremor, insomnia, depression, anxiety, and suicidal ideation.     History   Social History  . Marital Status: Divorced    Spouse Name: N/A    Number of Children: N/A  . Years of Education: N/A   Occupational History  . Not on file.   Social History Main Topics  . Smoking status: Never Smoker   . Smokeless tobacco: Never Used  . Alcohol Use: 0.6 oz/week    1 Glasses of wine per week  . Drug Use: No  . Sexual Activity: Yes    Birth Control/ Protection: Condom   Other Topics Concern  . Not on file   Social History Narrative  . No narrative on file    Objective:  Filed Vitals:   07/01/14 0814  BP: 124/76  Pulse: 76  Temp: 98.8 F (37.1 C)  Resp: 16     General appearance: alert, cooperative and appears stated age Ears: normal TM's and external ear canals both ears Throat: lips, mucosa, and tongue normal; teeth and gums normal Neck: no adenopathy, no carotid bruit, supple, symmetrical, trachea midline and thyroid not enlarged, symmetric, no tenderness/mass/nodules Back: symmetric, no curvature. ROM normal. No CVA tenderness. Lungs: clear to auscultation bilaterally Heart: regular rate and rhythm, S1, S2 normal, no murmur, click, rub or gallop Abdomen: soft, non-tender; bowel sounds normal; no masses,  no organomegaly Pulses: 2+ and symmetric Skin: Skin color, texture, turgor normal. No rashes or lesions Lymph nodes: Cervical, supraclavicular, and axillary nodes normal.  Assessment and Plan:  Obesity (BMI 30-39.9) She has lost 14 lb or 5% in the past 3 months using phentermine for appetite suppression .  Will continue phentermine for  3  more months. Lab Results  Component Value Date   ALT 30 07/01/2014   AST 26 07/01/2014   ALKPHOS 77 07/01/2014   BILITOT 0.5 07/01/2014   Lab Results  Component Value Date   NA 138 07/01/2014   K 3.9 07/01/2014   CL 101 07/01/2014   CO2 25 07/01/2014   Lab Results  Component Value Date   CREATININE 0.8 07/01/2014     Other and unspecified hyperlipidemia LDL was above goal in April.  Statin initiation was recommended but she would like to give diet another 6 months and repeat  labs.      Updated Medication List Outpatient Encounter Prescriptions as of 07/01/2014  Medication Sig  . cetirizine (ZYRTEC) 10 MG tablet Take 10 mg by mouth daily.  . cyclobenzaprine (FLEXERIL) 5 MG tablet Take 1 tablet (5 mg total) by mouth 3 (three) times daily as needed for muscle spasms.  Marland Kitchen FLUoxetine (PROZAC) 20 MG capsule Take an additional dose on all weekend days  . Melatonin 1 MG CAPS Take 1 capsule by mouth at bedtime as needed.  . Multiple Vitamin (MULTIVITAMIN) tablet Take 1 tablet by mouth daily.  . phentermine (ADIPEX-P) 37.5 MG tablet Take 0.5 tablets (18.75 mg total) by mouth 2 (two) times daily.  . [DISCONTINUED] FLUoxetine (PROZAC) 20 MG capsule Take an additional dose on all weekend days  . [DISCONTINUED] phentermine (ADIPEX-P) 37.5 MG tablet Take 0.5 tablets (18.75 mg total) by mouth 2 (two) times daily.  Marland Kitchen lisinopril-hydrochlorothiazide (PRINZIDE,ZESTORETIC) 10-12.5 MG per tablet Take 1 tablet by mouth daily.  . [DISCONTINUED] lisinopril-hydrochlorothiazide (PRINZIDE,ZESTORETIC) 10-12.5 MG per tablet Take 1 tablet by mouth daily.     Orders Placed This Encounter  Procedures  . [facility] MAMMOGRAPHY  . Tdap vaccine greater than or equal to 7yo IM    Return in about 3 months (around 10/01/2014).

## 2014-07-01 NOTE — Progress Notes (Signed)
Pre-visit discussion using our clinic review tool. No additional management support is needed unless otherwise documented below in the visit note.  

## 2014-07-02 ENCOUNTER — Encounter: Payer: Self-pay | Admitting: Internal Medicine

## 2014-07-02 LAB — COMPREHENSIVE METABOLIC PANEL
ALT: 30 U/L (ref 0–35)
AST: 26 U/L (ref 0–37)
Albumin: 4 g/dL (ref 3.5–5.2)
Alkaline Phosphatase: 77 U/L (ref 39–117)
BILIRUBIN TOTAL: 0.5 mg/dL (ref 0.2–1.2)
BUN: 14 mg/dL (ref 6–23)
CO2: 25 mEq/L (ref 19–32)
CREATININE: 0.8 mg/dL (ref 0.4–1.2)
Calcium: 9.7 mg/dL (ref 8.4–10.5)
Chloride: 101 mEq/L (ref 96–112)
GFR: 80.17 mL/min (ref >60.00)
GLUCOSE: 104 mg/dL — AB (ref 70–99)
Potassium: 3.9 mEq/L (ref 3.5–5.1)
Sodium: 138 mEq/L (ref 135–145)
Total Protein: 7.6 g/dL (ref 6.0–8.3)

## 2014-07-03 ENCOUNTER — Encounter: Payer: Self-pay | Admitting: Internal Medicine

## 2014-07-03 NOTE — Assessment & Plan Note (Signed)
LDL was above goal in April.  Statin initiation was recommended but she would like to give diet another 6 months and repeat labs.

## 2014-07-03 NOTE — Assessment & Plan Note (Addendum)
She has lost 14 lb or 5% in the past 3 months using phentermine for appetite suppression .  Will continue phentermine for  3  more months. Lab Results  Component Value Date   ALT 30 07/01/2014   AST 26 07/01/2014   ALKPHOS 77 07/01/2014   BILITOT 0.5 07/01/2014   Lab Results  Component Value Date   NA 138 07/01/2014   K 3.9 07/01/2014   CL 101 07/01/2014   CO2 25 07/01/2014   Lab Results  Component Value Date   CREATININE 0.8 07/01/2014

## 2014-10-03 ENCOUNTER — Encounter: Payer: Self-pay | Admitting: Internal Medicine

## 2014-10-05 NOTE — Telephone Encounter (Signed)
I updated chart and messaged back about labs and mammogram.

## 2014-12-31 ENCOUNTER — Encounter: Payer: Self-pay | Admitting: *Deleted

## 2014-12-31 NOTE — Telephone Encounter (Signed)
From: Laura Gray Sent: 09/12/2014 7:23 PM EDT To: Laura Gray Subject: RE:Flu Shot Clinic Saturday September 19, 2014  I received my Flu Shot at work [facility]) on 09/07/14.  ----- Message ----- From: Laura Gray Sent: 09/12/2014 6:37 PM EDT To: Laura Gray Subject: Flu Shot Clinic Saturday September 19, 2014  [facility] will hold a [place] [facility] on Saturday, September 26 from 9 am to noon. In order to plan appropriately, we ask you to please call the office to schedule an appointment time during the clinic. Our schedulers are ready to assist you, Monday through Friday, 8 am to 5 pm at [phone]. Thank you for choosing [facility] for your healthcare needs.

## 2015-01-06 ENCOUNTER — Ambulatory Visit: Payer: 59 | Admitting: Internal Medicine

## 2015-01-06 ENCOUNTER — Ambulatory Visit (INDEPENDENT_AMBULATORY_CARE_PROVIDER_SITE_OTHER): Payer: 59 | Admitting: Internal Medicine

## 2015-01-06 ENCOUNTER — Ambulatory Visit: Payer: Self-pay | Admitting: Internal Medicine

## 2015-01-06 ENCOUNTER — Encounter: Payer: Self-pay | Admitting: Internal Medicine

## 2015-01-06 VITALS — BP 118/84 | HR 70 | Temp 98.1°F | Resp 16 | Ht 64.0 in | Wt 244.8 lb

## 2015-01-06 DIAGNOSIS — N911 Secondary amenorrhea: Secondary | ICD-10-CM

## 2015-01-06 DIAGNOSIS — Z1239 Encounter for other screening for malignant neoplasm of breast: Secondary | ICD-10-CM

## 2015-01-06 DIAGNOSIS — I10 Essential (primary) hypertension: Secondary | ICD-10-CM

## 2015-01-06 DIAGNOSIS — E669 Obesity, unspecified: Secondary | ICD-10-CM

## 2015-01-06 DIAGNOSIS — N926 Irregular menstruation, unspecified: Secondary | ICD-10-CM

## 2015-01-06 LAB — LIPID PANEL
Cholesterol: 215 mg/dL — ABNORMAL HIGH (ref 0–200)
HDL: 55.5 mg/dL (ref >39.00)
LDL CALC: 140 mg/dL — AB (ref 0–99)
NonHDL: 159.5
TRIGLYCERIDES: 96 mg/dL (ref 0.0–149.0)
Total CHOL/HDL Ratio: 4
VLDL: 19.2 mg/dL (ref 0.0–40.0)

## 2015-01-06 LAB — COMPREHENSIVE METABOLIC PANEL
ALBUMIN: 4.1 g/dL (ref 3.5–5.2)
ALK PHOS: 85 U/L (ref 39–117)
ALT: 22 U/L (ref 0–35)
AST: 19 U/L (ref 0–37)
BILIRUBIN TOTAL: 0.6 mg/dL (ref 0.2–1.2)
BUN: 12 mg/dL (ref 6–23)
CO2: 28 mEq/L (ref 19–32)
Calcium: 9.6 mg/dL (ref 8.4–10.5)
Chloride: 101 mEq/L (ref 96–112)
Creatinine, Ser: 0.73 mg/dL (ref 0.40–1.20)
GFR: 90.2 mL/min (ref >60.00)
Glucose, Bld: 97 mg/dL (ref 70–99)
POTASSIUM: 4 meq/L (ref 3.5–5.1)
SODIUM: 137 meq/L (ref 135–145)
TOTAL PROTEIN: 7.3 g/dL (ref 6.0–8.3)

## 2015-01-06 LAB — FOLLICLE STIMULATING HORMONE: FSH: 21.4 m[IU]/mL

## 2015-01-06 LAB — TSH: TSH: 0.44 u[IU]/mL (ref 0.35–4.50)

## 2015-01-06 LAB — LUTEINIZING HORMONE: LH: 18.55 m[IU]/mL

## 2015-01-06 MED ORDER — PHENTERMINE HCL 37.5 MG PO TABS: 19.0000 mg | ORAL_TABLET | Freq: Two times a day (BID) | ORAL | Status: DC

## 2015-01-06 NOTE — Progress Notes (Signed)
Pre-visit discussion using our clinic review tool. No additional management support is needed unless otherwise documented below in the visit note.  

## 2015-01-06 NOTE — Patient Instructions (Signed)
I have authorized the use of phentermine for 3 months.  Please have your vital signs rechecked a week after starting, and return to see me in 3 months.  Your goal wt loss (minimum) is 13 lbs over the next 3 moths

## 2015-01-06 NOTE — Progress Notes (Signed)
Patient ID: Laura Gray, female   DOB: [birth year], 49 y.o.   MRN: [id number]  Patient Active Problem List   Diagnosis Date Noted  . Other and unspecified hyperlipidemia 09/20/2013  . Irregular menstrual cycle 09/20/2013  . Ankle fracture, right 09/20/2013  . Screening for breast cancer 09/20/2013  . Generalized anxiety disorder 06/19/2013  . Routine general medical examination at a health care facility 09/26/2012  . Hypertension 03/27/2012  . Obesity (BMI 30-39.9) 03/27/2012  . DES exposure in utero 03/27/2012    Subjective:  CC:   Chief Complaint  Patient presents with  . Follow-up  . Weight Check    HPI:   Laura Gray is a 49 y.o. female who presents for follow up on weight loss.  She has maintained her weight loss without use of phentermine  Over the past several months, but has been unable to lose more than 30 lbs and is requesting to resume the medication.   She is exercising 4 times per week and following a carbohydrate modified diet , and she wants to resume the medication. .     Past Medical History  Diagnosis Date  . Hypertension     Past Surgical History  Procedure Laterality Date  . Dilation and curettage of uterus  1989    miscarriage  . Exploratory  2000    x 2, ectopic pregnancy x 2, one rupture       The following portions of the patient's history were reviewed and updated as appropriate: Allergies, current medications, and problem list.    Review of Systems:   Patient denies headache, fevers, malaise, unintentional weight loss, skin rash, eye pain, sinus congestion and sinus pain, sore throat, dysphagia,  hemoptysis , cough, dyspnea, wheezing, chest pain, palpitations, orthopnea, edema, abdominal pain, nausea, melena, diarrhea, constipation, flank pain, dysuria, hematuria, urinary  Frequency, nocturia, numbness, tingling, seizures,  Focal weakness, Loss of consciousness,  Tremor, insomnia, depression, anxiety, and suicidal ideation.      History   Social History  . Marital Status: Divorced    Spouse Name: N/A    Number of Children: N/A  . Years of Education: N/A   Occupational History  . Not on file.   Social History Main Topics  . Smoking status: Never Smoker   . Smokeless tobacco: Never Used  . Alcohol Use: 0.6 oz/week    1 Glasses of wine per week  . Drug Use: No  . Sexual Activity: Yes    Birth Control/ Protection: Condom   Other Topics Concern  . Not on file   Social History Narrative    Objective:  Filed Vitals:   01/06/15 0842  BP: 118/84  Pulse: 70  Temp: 98.1 F (36.7 C)  Resp: 16     General appearance: alert, cooperative and appears stated age Ears: normal TM's and external ear canals both ears Throat: lips, mucosa, and tongue normal; teeth and gums normal Neck: no adenopathy, no carotid bruit, supple, symmetrical, trachea midline and thyroid not enlarged, symmetric, no tenderness/mass/nodules Back: symmetric, no curvature. ROM normal. No CVA tenderness. Lungs: clear to auscultation bilaterally Heart: regular rate and rhythm, S1, S2 normal, no murmur, click, rub or gallop Abdomen: soft, non-tender; bowel sounds normal; no masses,  no organomegaly Pulses: 2+ and symmetric Skin: Skin color, texture, turgor normal. No rashes or lesions Lymph nodes: Cervical, supraclavicular, and axillary nodes normal.  Assessment and Plan:  Obesity (BMI 30-39.9) I have congratulated her in reduction of   BMI and encouraged  Continued weight loss with goal of 10% of body weigh over the next 6 months using a low glycemic index diet and regular exercise a minimum of 5 days per week.   She has had difficulty losing additional weight due to increased appetite and is requesting to resume Phentermine.  She is aware of the possible side effects and risks and understands that    The medication will be discontinued if she has not lost 5% of her body weight over the next 3 months, which , based on today's  weight is 12 lbs.     Irregular menstrual cycle Secondary to perimenopause by current labs.    Other and unspecified hyperlipidemia Mild, improving with weight loss.  Will repeat in 6 months.    Hypertension Well controlled on current regimen. Renal function stable, no changes today.  Lab Results  Component Value Date   CREATININE 0.73 01/06/2015   Lab Results  Component Value Date   NA 137 01/06/2015   K 4.0 01/06/2015   CL 101 01/06/2015   CO2 28 01/06/2015       Updated Medication List Outpatient Encounter Prescriptions as of 01/06/2015  Medication Sig  . cetirizine (ZYRTEC) 10 MG tablet Take 10 mg by mouth daily.  . cyclobenzaprine (FLEXERIL) 5 MG tablet Take 1 tablet (5 mg total) by mouth 3 (three) times daily as needed for muscle spasms.  Marland Kitchen FLUoxetine (PROZAC) 20 MG capsule Take an additional dose on all weekend days  . lisinopril-hydrochlorothiazide (PRINZIDE,ZESTORETIC) 10-12.5 MG per tablet Take 1 tablet by mouth daily.  . Melatonin 1 MG CAPS Take 1 capsule by mouth at bedtime as needed.  . Multiple Vitamin (MULTIVITAMIN) tablet Take 1 tablet by mouth daily.  . phentermine (ADIPEX-P) 37.5 MG tablet Take 0.5 tablets (18.75 mg total) by mouth 2 (two) times daily.  . [DISCONTINUED] phentermine (ADIPEX-P) 37.5 MG tablet Take 0.5 tablets (18.75 mg total) by mouth 2 (two) times daily. (Patient not taking: Reported on 01/06/2015)     Orders Placed This Encounter  Procedures  . MM DIGITAL SCREENING BILATERAL  . Lipid panel  . TSH  . Comprehensive metabolic panel  . Follicle stimulating hormone  . LH    Return in about 3 months (around 04/07/2015).

## 2015-01-07 ENCOUNTER — Encounter: Payer: Self-pay | Admitting: Internal Medicine

## 2015-01-07 NOTE — Assessment & Plan Note (Signed)
Secondary to perimenopause by current labs.

## 2015-01-07 NOTE — Assessment & Plan Note (Signed)
Mild, improving with weight loss.  Will repeat in 6 months.

## 2015-01-07 NOTE — Assessment & Plan Note (Signed)
Well controlled on current regimen. Renal function stable, no changes today.  Lab Results  Component Value Date   CREATININE 0.73 01/06/2015   Lab Results  Component Value Date   NA 137 01/06/2015   K 4.0 01/06/2015   CL 101 01/06/2015   CO2 28 01/06/2015

## 2015-01-07 NOTE — Assessment & Plan Note (Addendum)
I have congratulated her in reduction of   BMI and encouraged  Continued weight loss with goal of 10% of body weigh over the next 6 months using a low glycemic index diet and regular exercise a minimum of 5 days per week.   She has had difficulty losing additional weight due to increased appetite and is requesting to resume Phentermine.  She is aware of the possible side effects and risks and understands that    The medication will be discontinued if she has not lost 5% of her body weight over the next 3 months, which , based on today's weight is 12 lbs.

## 2015-02-09 ENCOUNTER — Encounter: Payer: Self-pay | Admitting: *Deleted

## 2015-02-09 ENCOUNTER — Ambulatory Visit: Payer: Self-pay | Admitting: Internal Medicine

## 2015-02-09 LAB — HM MAMMOGRAPHY

## 2015-04-07 ENCOUNTER — Ambulatory Visit (INDEPENDENT_AMBULATORY_CARE_PROVIDER_SITE_OTHER): Payer: 59 | Admitting: Internal Medicine

## 2015-04-07 ENCOUNTER — Encounter: Payer: Self-pay | Admitting: Internal Medicine

## 2015-04-07 VITALS — BP 108/78 | HR 80 | Temp 97.9°F | Resp 14 | Ht 64.0 in | Wt 237.8 lb

## 2015-04-07 DIAGNOSIS — E669 Obesity, unspecified: Secondary | ICD-10-CM

## 2015-04-07 DIAGNOSIS — I10 Essential (primary) hypertension: Secondary | ICD-10-CM

## 2015-04-07 MED ORDER — LISINOPRIL 10 MG PO TABS: 10.0000 mg | ORAL_TABLET | Freq: Every day | ORAL | Status: DC

## 2015-04-07 MED ORDER — PHENTERMINE HCL 37.5 MG PO TABS: 19.0000 mg | ORAL_TABLET | Freq: Two times a day (BID) | ORAL | Status: DC

## 2015-04-07 NOTE — Patient Instructions (Signed)
Good  Work!  I have authorized the refill of phentermine for 3  More months  and a  return to see me in 3 months. Goal weight loss is 5% of today's weight by then or   12 lbs .

## 2015-04-07 NOTE — Progress Notes (Signed)
Patient ID: Laura Gray, female   DOB: [birth year], 49 y.o.   MRN: [id number]  Patient Active Problem List   Diagnosis Date Noted  . Other and unspecified hyperlipidemia 09/20/2013  . Irregular menstrual cycle 09/20/2013  . Ankle fracture, right 09/20/2013  . Screening for breast cancer 09/20/2013  . Generalized anxiety disorder 06/19/2013  . Routine general medical examination at a health care facility 09/26/2012  . Hypertension 03/27/2012  . Obesity (BMI 30-39.9) 03/27/2012  . DES exposure in utero 03/27/2012    Subjective:  CC:   Chief Complaint  Patient presents with  . Follow-up    medication refill  . Weight Check    HPI:   Laura Gray is a 49 y.o. female who presents for follow up on obesity and hypertension.   She has had a 37 lb weight loss since April 2014.  7 lbs  In the last 3 months using phentermine once daily.     Discussed making it 1/2 bid since goal was not met of 12 lbs.    Doing couch to 5 K   Goal is 160 lbs  Wt Readings from Last 3 Encounters:  04/07/15 237 lb 12 oz (107.843 kg)  01/06/15 244 lb 12 oz (111.018 kg)  07/01/14 260 lb 8 oz (118.162 kg)    Hypertension:  She has noted lowere readings since she has lost weight  And is taking lisinopril/hctz daily.  We discussed stopping hctz , conitnue lisinopril 10    Past Medical History  Diagnosis Date  . Hypertension     Past Surgical History  Procedure Laterality Date  . Dilation and curettage of uterus  1989    miscarriage  . Exploratory  2000    x 2, ectopic pregnancy x 2, one rupture       The following portions of the patient's history were reviewed and updated as appropriate: Allergies, current medications, and problem list.    Review of Systems:   Patient denies headache, fevers, malaise, unintentional weight loss, skin rash, eye pain, sinus congestion and sinus pain, sore throat, dysphagia,  hemoptysis , cough, dyspnea, wheezing, chest pain, palpitations, orthopnea, edema,  abdominal pain, nausea, melena, diarrhea, constipation, flank pain, dysuria, hematuria, urinary  Frequency, nocturia, numbness, tingling, seizures,  Focal weakness, Loss of consciousness,  Tremor, insomnia, depression, anxiety, and suicidal ideation.     History   Social History  . Marital Status: Legally Separated    Spouse Name: N/A  . Number of Children: N/A  . Years of Education: N/A   Occupational History  . Not on file.   Social History Main Topics  . Smoking status: Never Smoker   . Smokeless tobacco: Never Used  . Alcohol Use: 0.6 oz/week    1 Glasses of wine per week  . Drug Use: No  . Sexual Activity: Yes    Birth Control/ Protection: Condom   Other Topics Concern  . Not on file   Social History Narrative    Objective:  Filed Vitals:   04/07/15 0859  BP: 108/78  Pulse: 80  Temp: 97.9 F (36.6 C)  Resp: 14     General appearance: alert, cooperative and appears stated age Ears: normal TM's and external ear canals both ears Throat: lips, mucosa, and tongue normal; teeth and gums normal Neck: no adenopathy, no carotid bruit, supple, symmetrical, trachea midline and thyroid not enlarged, symmetric, no tenderness/mass/nodules Back: symmetric, no curvature. ROM normal. No CVA tenderness. Lungs: clear to auscultation bilaterally Heart: regular  rate and rhythm, S1, S2 normal, no murmur, click, rub or gallop Abdomen: soft, non-tender; bowel sounds normal; no masses,  no organomegaly Pulses: 2+ and symmetric Skin: Skin color, texture, turgor normal. No rashes or lesions Lymph nodes: Cervical, supraclavicular, and axillary nodes normal.  Assessment and Plan:  Obesity (BMI 30-39.9) I have congratulated her in reduction of   BMI and encouraged  Continued weight loss with goal of 10% of body weight over the next 6 months using a low glycemic index diet and regular exercise a minimum of 5 days per week.   She has had difficulty losing additional weight due to  increased appetite and is requesting to resume Phentermine.  She is aware of the possible side effects and risks and understands that  the medication will be discontinued if she has not lost 5% of her body weight over the next 3 months, which , based on today's weight is 12 lbs.    Wt Readings from Last 3 Encounters:  04/07/15 237 lb 12 oz (107.843 kg)  01/06/15 244 lb 12 oz (111.018 kg)  07/01/14 260 lb 8 oz (118.162 kg)       Hypertension Well controlled on current regimen. Given her current readings of systolics < 329,  Will stop the hctz. .  Lab Results  Component Value Date   CREATININE 0.73 01/06/2015   Lab Results  Component Value Date   NA 137 01/06/2015   K 4.0 01/06/2015   CL 101 01/06/2015   CO2 28 01/06/2015         Updated Medication List Outpatient Encounter Prescriptions as of 04/07/2015  Medication Sig  . cetirizine (ZYRTEC) 10 MG tablet Take 10 mg by mouth daily.  . cyclobenzaprine (FLEXERIL) 5 MG tablet Take 1 tablet (5 mg total) by mouth 3 (three) times daily as needed for muscle spasms.  Marland Kitchen FLUoxetine (PROZAC) 20 MG capsule Take an additional dose on all weekend days  . Melatonin 1 MG CAPS Take 1 capsule by mouth at bedtime as needed.  . phentermine (ADIPEX-P) 37.5 MG tablet Take 0.5 tablets (18.75 mg total) by mouth 2 (two) times daily.  . [DISCONTINUED] lisinopril-hydrochlorothiazide (PRINZIDE,ZESTORETIC) 10-12.5 MG per tablet Take 1 tablet by mouth daily.  . [DISCONTINUED] phentermine (ADIPEX-P) 37.5 MG tablet Take 0.5 tablets (18.75 mg total) by mouth 2 (two) times daily.  Marland Kitchen lisinopril (PRINIVIL,ZESTRIL) 10 MG tablet Take 1 tablet (10 mg total) by mouth daily.  . Multiple Vitamin (MULTIVITAMIN) tablet Take 1 tablet by mouth daily.     No orders of the defined types were placed in this encounter.    No Follow-up on file.

## 2015-04-10 NOTE — Assessment & Plan Note (Signed)
Well controlled on current regimen. Given her current readings of systolics < 335,  Will stop the hctz. .  Lab Results  Component Value Date   CREATININE 0.73 01/06/2015   Lab Results  Component Value Date   NA 137 01/06/2015   K 4.0 01/06/2015   CL 101 01/06/2015   CO2 28 01/06/2015

## 2015-04-10 NOTE — Assessment & Plan Note (Signed)
I have congratulated her in reduction of   BMI and encouraged  Continued weight loss with goal of 10% of body weight over the next 6 months using a low glycemic index diet and regular exercise a minimum of 5 days per week.   She has had difficulty losing additional weight due to increased appetite and is requesting to resume Phentermine.  She is aware of the possible side effects and risks and understands that  the medication will be discontinued if she has not lost 5% of her body weight over the next 3 months, which , based on today's weight is 12 lbs.    Wt Readings from Last 3 Encounters:  04/07/15 237 lb 12 oz (107.843 kg)  01/06/15 244 lb 12 oz (111.018 kg)  07/01/14 260 lb 8 oz (118.162 kg)

## 2015-07-15 ENCOUNTER — Telehealth: Payer: Self-pay | Admitting: Internal Medicine

## 2015-07-15 ENCOUNTER — Ambulatory Visit (INDEPENDENT_AMBULATORY_CARE_PROVIDER_SITE_OTHER): Payer: 59 | Admitting: Internal Medicine

## 2015-07-15 ENCOUNTER — Encounter: Payer: Self-pay | Admitting: Internal Medicine

## 2015-07-15 VITALS — BP 118/76 | HR 75 | Temp 98.4°F | Resp 12 | Ht 64.0 in | Wt 243.5 lb

## 2015-07-15 DIAGNOSIS — R140 Abdominal distension (gaseous): Secondary | ICD-10-CM | POA: Diagnosis not present

## 2015-07-15 DIAGNOSIS — E669 Obesity, unspecified: Secondary | ICD-10-CM

## 2015-07-15 DIAGNOSIS — R5383 Other fatigue: Secondary | ICD-10-CM

## 2015-07-15 LAB — COMPREHENSIVE METABOLIC PANEL
ALT: 20 U/L (ref 0–35)
AST: 15 U/L (ref 0–37)
Albumin: 3.9 g/dL (ref 3.5–5.2)
Alkaline Phosphatase: 66 U/L (ref 39–117)
BUN: 11 mg/dL (ref 6–23)
CALCIUM: 9.5 mg/dL (ref 8.4–10.5)
CO2: 30 mEq/L (ref 19–32)
CREATININE: 0.81 mg/dL (ref 0.40–1.20)
Chloride: 101 mEq/L (ref 96–112)
GFR: 79.83 mL/min (ref >60.00)
Glucose, Bld: 103 mg/dL — ABNORMAL HIGH (ref 70–99)
Potassium: 4.7 mEq/L (ref 3.5–5.1)
Sodium: 135 mEq/L (ref 135–145)
Total Bilirubin: 0.5 mg/dL (ref 0.2–1.2)
Total Protein: 6.8 g/dL (ref 6.0–8.3)

## 2015-07-15 MED ORDER — FLUOXETINE HCL 20 MG PO CAPS: ORAL_CAPSULE | ORAL | Status: DC

## 2015-07-15 MED ORDER — PHENTERMINE HCL 37.5 MG PO TABS: 19.0000 mg | ORAL_TABLET | Freq: Two times a day (BID) | ORAL | Status: DC

## 2015-07-15 NOTE — Telephone Encounter (Signed)
RIGHT!  LABS SIGNE DO FF.  THANKS

## 2015-07-15 NOTE — Patient Instructions (Signed)
I have authorized the use of phentermine for 1 more month   .  If you have not lost  4 or 5 lbs by the end of the  month we will have to try something else.   Ultrasound and Ca 125 ordered,  If ovaries are normal,  I will call in a diuretic for the bloating  The  diet I discussed with you today is the 10 day Green Smoothie Cleansing /Detox Diet by Linden Dolin . available on [employer] for around $10.  This is not a low carb or a weight loss diet,  It is fundamentally a "cleansing" low fat diet that eliminates sugar, gluten, caffeine, alcohol and dairy for 10 days .  What you add back after the initial ten days is entirely up to  you!  You can expect to lose 5 to 10 lbs depending on how strict you are.   I suggest drinking 2 smoothies daily and keeping one chewable meal (but keep it simple, like baked fish and salad, rice or bok choy) .  You snack primarily on fresh  fruit, egg whites and judicious quantities of nuts. You can add vegetable based protein powder (nothing with whey , since whey is dairy) in it.  [employer] has a [employer], [employer] .  It does require some form of a nutrient extractor (Vita Mix, a electric juicer,  Or a Nutribullet Rx).  i have found that using frozen fruits is much more convenient and cost effective. You can even find plenty of organic fruit in the frozen fruit section of [employer].  Just thaw what you need for the following day the night before in the refrigerator (to avoid jamming up your machine)

## 2015-07-15 NOTE — Progress Notes (Signed)
Pre-visit discussion using our clinic review tool. No additional management support is needed unless otherwise documented below in the visit note.  

## 2015-07-15 NOTE — Telephone Encounter (Signed)
Pt stated at check out that Dr. Derrel Nip  wanted her to having fasting labs done on Friday.. No orders are in the system.. Made lab appt for Friday at 8:15.

## 2015-07-15 NOTE — Telephone Encounter (Signed)
NO Cmet since one Obtained today right?

## 2015-07-15 NOTE — Progress Notes (Signed)
Subjective:  Patient ID: Laura Gray, female    DOB: Jun 15, 1966  Age: 49 y.o. MRN: [id number]  CC: The primary encounter diagnosis was Abdominal bloating. A diagnosis of Obesity (BMI 30-39.9) was also pertinent to this visit.  HPI ALAISA Gray presents for follow up on hypertension ,  obesityy with medication prescribed for weight loss.  Despite taking phentermine as prescribed she has gained 6 lbs since appril.  Lost 4 lbs initially , then started gainingin  Diet reviewed.   Has been eating bread twice daily with lunch and dinner.    Laura Gray 696 cal [place] [address]pp ,  Breakfast lately has been Cereal, usually [place].  Using nondairy liquid creamer 3 CUPS DAILY .  Marland Kitchen  Water is only other beverage  And averaging 49 yo 80 ounces daily.  Feels bloated but having daily bowel movement,s  Fasting today.  Using sugar 2 teaspoons per cup.  Lunch is a salad if at work.  Or a [employer], if home a sandwhich on 1 piece of bread.  Dinner is variable , eating out with son varies , sometimes hibachi,  Grilled chicken and vegetables.       Exercising less due to kids being at home.  Son is in football practice so she tries to go to the walking track  .  She has been feeling more bloated for the past month and having some suprapubicdiscomfort.  Bowels are moving regularly .  Outpatient Prescriptions Prior to Visit  Medication Sig Dispense Refill  . cetirizine (ZYRTEC) 10 MG tablet Take 10 mg by mouth daily.    . cyclobenzaprine (FLEXERIL) 5 MG tablet Take 1 tablet (5 mg total) by mouth 3 (three) times daily as needed for muscle spasms. 90 tablet 1  . lisinopril (PRINIVIL,ZESTRIL) 10 MG tablet Take 1 tablet (10 mg total) by mouth daily. 90 tablet 3  . Melatonin 1 MG CAPS Take 1 capsule by mouth at bedtime as needed.    . Multiple Vitamin (MULTIVITAMIN) tablet Take 1 tablet by mouth daily.    Marland Kitchen FLUoxetine (PROZAC) 20 MG capsule Take an additional dose on all weekend days 114 capsule 3  . phentermine  (ADIPEX-P) 37.5 MG tablet Take 0.5 tablets (18.75 mg total) by mouth 2 (two) times daily. 30 tablet 2   No facility-administered medications prior to visit.    Review of Systems;  Patient denies headache, fevers, malaise, unintentional weight loss, skin rash, eye pain, sinus congestion and sinus pain, sore throat, dysphagia,  hemoptysis , cough, dyspnea, wheezing, chest pain, palpitations, orthopnea, edema, abdominal pain, nausea, melena, diarrhea, constipation, flank pain, dysuria, hematuria, urinary  Frequency, nocturia, numbness, tingling, seizures,  Focal weakness, Loss of consciousness,  Tremor, insomnia, depression, anxiety, and suicidal ideation.      Objective:  BP 118/76 mmHg  Pulse 75  Temp(Src) 98.4 F (36.9 C) (Oral)  Resp 12  Ht 5' 4"  (1.626 m)  Wt 243 lb 8 oz (110.451 kg)  BMI 41.78 kg/m2  SpO2 99%  LMP 06/17/2015  BP Readings from Last 3 Encounters:  07/15/15 118/76  04/07/15 108/78  01/06/15 118/84    Wt Readings from Last 3 Encounters:  07/15/15 243 lb 8 oz (110.451 kg)  04/07/15 237 lb 12 oz (107.843 kg)  01/06/15 244 lb 12 oz (111.018 kg)    General appearance: alert, cooperative and appears stated age Ears: normal TM's and external ear canals both ears Throat: lips, mucosa, and tongue normal; teeth and gums normal Neck: no adenopathy,  no carotid bruit, supple, symmetrical, trachea midline and thyroid not enlarged, symmetric, no tenderness/mass/nodules Back: symmetric, no curvature. ROM normal. No CVA tenderness. Lungs: clear to auscultation bilaterally Heart: regular rate and rhythm, S1, S2 normal, no murmur, click, rub or gallop Abdomen: soft, non-tender; bowel sounds normal; no masses,  no organomegaly Pulses: 2+ and symmetric Skin: Skin color, texture, turgor normal. No rashes or lesions Lymph nodes: Cervical, supraclavicular, and axillary nodes normal.  Lab Results  Component Value Date   HGBA1C 5.2 09/18/2013    Lab Results  Component  Value Date   CREATININE 0.81 07/15/2015   CREATININE 0.73 01/06/2015   CREATININE 0.8 07/01/2014    Lab Results  Component Value Date   WBC 7.3 06/17/2013   HGB 13.6 06/17/2013   HCT 39.1 06/17/2013   PLT 255.0 06/17/2013   GLUCOSE 103* 07/15/2015   CHOL 215* 01/06/2015   TRIG 96.0 01/06/2015   HDL 55.50 01/06/2015   LDLDIRECT 145.6 09/18/2013   LDLCALC 140* 01/06/2015   ALT 20 07/15/2015   AST 15 07/15/2015   NA 135 07/15/2015   K 4.7 07/15/2015   CL 101 07/15/2015   CREATININE 0.81 07/15/2015   BUN 11 07/15/2015   CO2 30 07/15/2015   TSH 0.44 01/06/2015   HGBA1C 5.2 09/18/2013   MICROALBUR 0.3 09/18/2013    No results found.  Assessment & Plan:   Problem List Items Addressed This Visit      Unprioritized   Obesity (BMI 30-39.9)    Diet and need for exercise regimen reviewed.  Will refill phentermine for one month and      reassess,       Relevant Medications   phentermine (ADIPEX-P) 37.5 MG tablet   Abdominal bloating - Primary    CA 125 and pelvic ultrasound have been ordered       Relevant Orders   CA 125 (Completed)   US Transvaginal Non-OB   Comp Met (CMET) (Completed)     A total of 25 minutes of face to face time was spent with patient more than half of which was spent in counselling about the above mentioned conditions  and coordination of care   I am having Ms. Laura Gray maintain her multivitamin, cetirizine, Melatonin, cyclobenzaprine, lisinopril, FLUoxetine, and phentermine.  Meds ordered this encounter  Medications  . FLUoxetine (PROZAC) 20 MG capsule    Sig: Take an additional dose on all weekend days    Dispense:  114 capsule    Refill:  3  . phentermine (ADIPEX-P) 37.5 MG tablet    Sig: Take 0.5 tablets (18.75 mg total) by mouth 2 (two) times daily.    Dispense:  30 tablet    Refill:  0    Medications Discontinued During This Encounter  Medication Reason  . FLUoxetine (PROZAC) 20 MG capsule Reorder  . phentermine (ADIPEX-P) 37.5  MG tablet Reorder    Follow-up: Return in about 4 weeks (around 08/12/2015).   Laura Mc, MD

## 2015-07-16 ENCOUNTER — Encounter: Payer: Self-pay | Admitting: Internal Medicine

## 2015-07-16 ENCOUNTER — Other Ambulatory Visit: Payer: 59

## 2015-07-16 LAB — CA 125: CA 125: 8 U/mL (ref <35)

## 2015-07-16 NOTE — Telephone Encounter (Signed)
The patient was on the schedule for a lab appointment it was not canceled .

## 2015-07-17 ENCOUNTER — Other Ambulatory Visit: Payer: Self-pay | Admitting: Internal Medicine

## 2015-07-17 DIAGNOSIS — Z01411 Encounter for gynecological examination (general) (routine) with abnormal findings: Secondary | ICD-10-CM

## 2015-07-17 NOTE — Assessment & Plan Note (Signed)
CA 125 and pelvic ultrasound have been ordered

## 2015-07-17 NOTE — Assessment & Plan Note (Addendum)
Diet and need for exercise regimen reviewed.  Will refill phentermine for one month and      reassess,

## 2015-07-19 ENCOUNTER — Encounter: Payer: Self-pay | Admitting: Internal Medicine

## 2015-07-21 ENCOUNTER — Encounter: Payer: Self-pay | Admitting: Internal Medicine

## 2015-07-22 ENCOUNTER — Ambulatory Visit: Payer: 59

## 2015-07-27 ENCOUNTER — Ambulatory Visit
Admission: RE | Admit: 2015-07-27 | Discharge: 2015-07-27 | Disposition: A | Discharge status: Home / Self Care | Payer: 59 | Source: Ambulatory Visit | Attending: Internal Medicine | Admitting: Internal Medicine

## 2015-07-27 DIAGNOSIS — R140 Abdominal distension (gaseous): Secondary | ICD-10-CM | POA: Diagnosis present

## 2015-07-27 DIAGNOSIS — R102 Pelvic and perineal pain: Secondary | ICD-10-CM | POA: Diagnosis present

## 2015-07-27 DIAGNOSIS — Z01411 Encounter for gynecological examination (general) (routine) with abnormal findings: Secondary | ICD-10-CM

## 2015-08-01 ENCOUNTER — Encounter: Payer: Self-pay | Admitting: Internal Medicine

## 2016-03-06 ENCOUNTER — Encounter: Payer: Self-pay | Admitting: Internal Medicine

## 2016-06-07 ENCOUNTER — Encounter: Payer: Self-pay | Admitting: Physician Assistant

## 2016-06-07 ENCOUNTER — Ambulatory Visit: Payer: Self-pay | Admitting: Physician Assistant

## 2016-06-07 VITALS — BP 120/78 | HR 76 | Temp 98.4°F

## 2016-06-07 DIAGNOSIS — J069 Acute upper respiratory infection, unspecified: Secondary | ICD-10-CM

## 2016-06-07 MED ORDER — AZITHROMYCIN 250 MG PO TABS: ORAL_TABLET | ORAL | Status: DC

## 2016-06-07 MED ORDER — HYDROCOD POLST-CPM POLST ER 10-8 MG/5ML PO SUER: 5.0000 mL | Freq: Two times a day (BID) | ORAL | Status: DC | PRN

## 2016-06-07 NOTE — Progress Notes (Signed)
S: C/o cough and congestion for 2 weeks, some sinus drainage, chest is sore from coughing, denies  fever, chills;  cough is dry and hacking; keeping pt awake at night;  denies cardiac type chest pain or sob, v/d, abd pain Remainder ros neg  O: vitals wnl, nad, tms clear, throat injected, neck supple no lymph, lungs with wheezing, cv rrr, neuro intact  A:  Acute uri   P:  rx medication:  Zpack, tussionex;   use otc meds, tylenol or motrin as needed for fever/chills, return if not better in 3 -5 days, return earlier if worsening

## 2016-10-01 ENCOUNTER — Encounter: Payer: Self-pay | Admitting: Internal Medicine

## 2016-10-02 MED ORDER — FLUOXETINE HCL 20 MG PO CAPS: ORAL_CAPSULE | ORAL | 0 refills | Status: DC

## 2016-10-02 MED ORDER — LISINOPRIL 10 MG PO TABS
10.0000 mg | ORAL_TABLET | Freq: Every day | ORAL | 0 refills | Status: DC
Start: 2016-10-02 — End: 2016-10-23

## 2016-10-03 ENCOUNTER — Other Ambulatory Visit: Payer: Self-pay | Admitting: Internal Medicine

## 2016-10-18 ENCOUNTER — Ambulatory Visit (INDEPENDENT_AMBULATORY_CARE_PROVIDER_SITE_OTHER): Payer: 59 | Admitting: Internal Medicine

## 2016-10-18 ENCOUNTER — Encounter: Payer: Self-pay | Admitting: Internal Medicine

## 2016-10-18 VITALS — BP 132/90 | HR 62 | Temp 98.1°F | Resp 12 | Ht 64.0 in | Wt 292.0 lb

## 2016-10-18 DIAGNOSIS — E559 Vitamin D deficiency, unspecified: Secondary | ICD-10-CM

## 2016-10-18 DIAGNOSIS — I10 Essential (primary) hypertension: Secondary | ICD-10-CM

## 2016-10-18 DIAGNOSIS — R5383 Other fatigue: Secondary | ICD-10-CM

## 2016-10-18 DIAGNOSIS — R635 Abnormal weight gain: Secondary | ICD-10-CM

## 2016-10-18 DIAGNOSIS — Z1231 Encounter for screening mammogram for malignant neoplasm of breast: Secondary | ICD-10-CM

## 2016-10-18 DIAGNOSIS — F411 Generalized anxiety disorder: Secondary | ICD-10-CM

## 2016-10-18 DIAGNOSIS — E669 Obesity, unspecified: Secondary | ICD-10-CM

## 2016-10-18 DIAGNOSIS — N912 Amenorrhea, unspecified: Secondary | ICD-10-CM | POA: Diagnosis not present

## 2016-10-18 DIAGNOSIS — Z9189 Other specified personal risk factors, not elsewhere classified: Secondary | ICD-10-CM

## 2016-10-18 DIAGNOSIS — N911 Secondary amenorrhea: Secondary | ICD-10-CM

## 2016-10-18 DIAGNOSIS — Z91B Personal risk factor of exposure to diethylstilbestrol: Secondary | ICD-10-CM

## 2016-10-18 DIAGNOSIS — Z1239 Encounter for other screening for malignant neoplasm of breast: Secondary | ICD-10-CM

## 2016-10-18 LAB — COMPREHENSIVE METABOLIC PANEL
ALBUMIN: 4.1 g/dL (ref 3.5–5.2)
ALT: 26 U/L (ref 0–35)
AST: 19 U/L (ref 0–37)
Alkaline Phosphatase: 70 U/L (ref 39–117)
BUN: 14 mg/dL (ref 6–23)
CALCIUM: 9.3 mg/dL (ref 8.4–10.5)
CHLORIDE: 102 meq/L (ref 96–112)
CO2: 26 mEq/L (ref 19–32)
Creatinine, Ser: 0.8 mg/dL (ref 0.40–1.20)
GFR: 80.56 mL/min (ref >60.00)
Glucose, Bld: 114 mg/dL — ABNORMAL HIGH (ref 70–99)
POTASSIUM: 3.8 meq/L (ref 3.5–5.1)
Sodium: 136 mEq/L (ref 135–145)
Total Bilirubin: 0.7 mg/dL (ref 0.2–1.2)
Total Protein: 7.3 g/dL (ref 6.0–8.3)

## 2016-10-18 LAB — TSH: TSH: 0.64 u[IU]/mL (ref 0.35–4.50)

## 2016-10-18 LAB — HEMOGLOBIN A1C: Hgb A1c MFr Bld: 5.5 % (ref 4.6–6.5)

## 2016-10-18 LAB — LIPID PANEL
CHOL/HDL RATIO: 4
Cholesterol: 210 mg/dL — ABNORMAL HIGH (ref 0–200)
HDL: 54.3 mg/dL (ref >39.00)
LDL CALC: 131 mg/dL — AB (ref 0–99)
NonHDL: 156.01
TRIGLYCERIDES: 125 mg/dL (ref 0.0–149.0)
VLDL: 25 mg/dL (ref 0.0–40.0)

## 2016-10-18 LAB — VITAMIN D 25 HYDROXY (VIT D DEFICIENCY, FRACTURES): VITD: 14.71 ng/mL — AB (ref 30.00–100.00)

## 2016-10-18 LAB — FOLLICLE STIMULATING HORMONE: FSH: 57.7 m[IU]/mL

## 2016-10-18 LAB — LUTEINIZING HORMONE: LH: 29.31 m[IU]/mL

## 2016-10-18 MED ORDER — CYCLOBENZAPRINE HCL 5 MG PO TABS: 5.0000 mg | ORAL_TABLET | Freq: Three times a day (TID) | ORAL | 1 refills | Status: DC | PRN

## 2016-10-18 MED ORDER — FLUOXETINE HCL 20 MG PO CAPS: ORAL_CAPSULE | ORAL | 3 refills | Status: DC

## 2016-10-18 NOTE — Progress Notes (Signed)
Pre-visit discussion using our clinic review tool. No additional management support is needed unless otherwise documented below in the visit note.  

## 2016-10-18 NOTE — Patient Instructions (Signed)
Please consider joining weight watchers!  Start walking 30 minutes daily  Retrun for PAP smear in 1-2 months   Your mammogram has been ordered.  Please schedule

## 2016-10-18 NOTE — Progress Notes (Addendum)
Subjective:  Patient ID: Laura Gray, female    DOB: October 28, 1966  Age: 50 y.o. MRN: [id number]  CC: The primary encounter diagnosis was Fatigue, unspecified type. Diagnoses of Breast cancer screening, Amenorrhea, Vitamin D deficiency, Weight gain, Essential hypertension, Amenorrhea, secondary, Obesity (BMI 30-39.9), DES exposure in utero, and Generalized anxiety disorder were also pertinent to this visit.  HPI Laura Gray presents for follow up on hypertension  Obesity and GAD with insomnia .  She was Last seen 1.5 years ago, and has run out of her BP medications.    Last  BP measured while taking lisinopril  was 120/74.    She has had a 50 lb weight gain since July 2016 . 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  Aggravated by work and weight gain  Not exercising or following a restricted diet.   NEEDS MAMMOGRAM   Due for PAP smear,  last period was over a year ago . Some hot flashes occurring at work.     Outpatient Medications Prior to Visit  Medication Sig Dispense Refill  . cetirizine (ZYRTEC) 10 MG tablet Take 10 mg by mouth daily.    Marland Kitchen lisinopril (PRINIVIL,ZESTRIL) 10 MG tablet Take 1 tablet (10 mg total) by mouth daily. 90 tablet 0  . Melatonin 1 MG CAPS Take 1 capsule by mouth at bedtime as needed.    . Multiple Vitamin (MULTIVITAMIN) tablet Take 1 tablet by mouth daily.    . cyclobenzaprine (FLEXERIL) 5 MG tablet Take 1 tablet (5 mg total) by mouth 3 (three) times daily as needed for muscle spasms. 90 tablet 1  . FLUoxetine (PROZAC) 20 MG capsule Take an additional dose on all weekend days 114 capsule 0  . chlorpheniramine-HYDROcodone (TUSSIONEX PENNKINETIC ER) 10-8 MG/5ML SUER Take 5 mLs by mouth every 12 (twelve) hours as needed for cough. (Patient not taking: Reported on 10/18/2016) 150 mL 0  . azithromycin (ZITHROMAX Z-PAK) 250 MG tablet 2 pills today then 1 pill a day for 4 days (Patient not taking:  Reported on 10/18/2016) 6 each 0  . phentermine (ADIPEX-P) 37.5 MG tablet Take 0.5 tablets (18.75 mg total) by mouth 2 (two) times daily. (Patient not taking: Reported on 10/18/2016) 30 tablet 0   No facility-administered medications prior to visit.     Review of Systems;  Patient denies headache, fevers, malaise, unintentional weight loss, skin rash, eye pain, sinus congestion and sinus pain, sore throat, dysphagia,  hemoptysis , cough, dyspnea, wheezing, chest pain, palpitations, orthopnea, edema, abdominal pain, nausea, melena, diarrhea, constipation, flank pain, dysuria, hematuria, urinary  Frequency, nocturia, numbness, tingling, seizures,  Focal weakness, Loss of consciousness,  Tremor, insomnia, depression, anxiety, and suicidal ideation.      Objective:  BP 132/90   Pulse 62   Temp 98.1 F (36.7 C) (Oral)   Resp 12   Ht 5\' 4"  (1.626 m)   Wt 292 lb (132.5 kg)   LMP 07/24/2015   SpO2 98%   BMI 50.12 kg/m   BP Readings from Last 3 Encounters:  10/18/16 132/90  06/07/16 120/78  07/15/15 118/76    Wt Readings from Last 3 Encounters:  10/18/16 292 lb (132.5 kg)  07/15/15 243 lb 8 oz (110.5 kg)  04/07/15 237 lb 12 oz (107.8 kg)    General appearance: alert, cooperative and appears stated age Ears: normal TM's and external ear canals both ears Throat: lips,  mucosa, and tongue normal; teeth and gums normal Neck: no adenopathy, no carotid bruit, supple, symmetrical, trachea midline and thyroid not enlarged, symmetric, no tenderness/mass/nodules Back: symmetric, no curvature. ROM normal. No CVA tenderness. Lungs: clear to auscultation bilaterally Heart: regular rate and rhythm, S1, S2 normal, no murmur, click, rub or gallop Abdomen: soft, non-tender; bowel sounds normal; no masses,  no organomegaly Pulses: 2+ and symmetric Skin: Skin color, texture, turgor normal. No rashes or lesions Lymph nodes: Cervical, supraclavicular, and axillary nodes normal.  Lab Results    Component Value Date   HGBA1C 5.5 10/18/2016   HGBA1C 5.2 09/18/2013    Lab Results  Component Value Date   CREATININE 0.80 10/18/2016   CREATININE 0.81 07/15/2015   CREATININE 0.73 01/06/2015    Lab Results  Component Value Date   WBC 7.3 06/17/2013   HGB 13.6 06/17/2013   HCT 39.1 06/17/2013   PLT 255.0 06/17/2013   GLUCOSE 114 (H) 10/18/2016   CHOL 210 (H) 10/18/2016   TRIG 125.0 10/18/2016   HDL 54.30 10/18/2016   LDLDIRECT 145.6 09/18/2013   LDLCALC 131 (H) 10/18/2016   ALT 26 10/18/2016   AST 19 10/18/2016   NA 136 10/18/2016   K 3.8 10/18/2016   CL 102 10/18/2016   CREATININE 0.80 10/18/2016   BUN 14 10/18/2016   CO2 26 10/18/2016   TSH 0.64 10/18/2016   HGBA1C 5.5 10/18/2016   MICROALBUR 0.3 09/18/2013    US Transvaginal Non-ob  Result Date: 07/27/2015 CLINICAL DATA:  Pelvic pain and bloating for 1 month EXAM: TRANSABDOMINAL AND TRANSVAGINAL ULTRASOUND OF PELVIS TECHNIQUE: Both transabdominal and transvaginal ultrasound examinations of the pelvis were performed. Transabdominal technique was performed for global imaging of the pelvis including uterus, ovaries, adnexal regions, and pelvic cul-de-sac. It was necessary to proceed with endovaginal exam following the transabdominal exam to visualize the endometrium and ovaries. COMPARISON:  No similar prior exam is available at this institution for comparison or on [facility] PACS. FINDINGS: Uterus Measurements: 8.6 x 4.7 x 4.4 cm, anteverted, anteflexed. No fibroids or other mass visualized. Endometrium Thickness: 4 mm.  No focal abnormality visualized. Right ovary Measurements: 2.5 x 1.7 x 1.2 cm, seen transabdominally only. Normal appearance/no adnexal mass. Left ovary Measurements: 2.3 x 2.1 x 1.6 cm, seen transabdominally only. Normal appearance/no adnexal mass. Other findings Trace free fluid identified IMPRESSION: Normal exam. Electronically Signed   By: Conchita Paris M.D.   On: 07/27/2015 15:59   US Pelvis  Complete  Result Date: 07/27/2015 CLINICAL DATA:  Pelvic pain and bloating for 1 month EXAM: TRANSABDOMINAL AND TRANSVAGINAL ULTRASOUND OF PELVIS TECHNIQUE: Both transabdominal and transvaginal ultrasound examinations of the pelvis were performed. Transabdominal technique was performed for global imaging of the pelvis including uterus, ovaries, adnexal regions, and pelvic cul-de-sac. It was necessary to proceed with endovaginal exam following the transabdominal exam to visualize the endometrium and ovaries. COMPARISON:  No similar prior exam is available at this institution for comparison or on [facility] PACS. FINDINGS: Uterus Measurements: 8.6 x 4.7 x 4.4 cm, anteverted, anteflexed. No fibroids or other mass visualized. Endometrium Thickness: 4 mm.  No focal abnormality visualized. Right ovary Measurements: 2.5 x 1.7 x 1.2 cm, seen transabdominally only. Normal appearance/no adnexal mass. Left ovary Measurements: 2.3 x 2.1 x 1.6 cm, seen transabdominally only. Normal appearance/no adnexal mass. Other findings Trace free fluid identified IMPRESSION: Normal exam. Electronically Signed   By: Conchita Paris M.D.   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       Relevant Orders   VITAMIN D 25 Hydroxy (Vit-D Deficiency, Fractures) (Completed)   Hypertension    Elevated today due to lapse in medications.  Resume lisinopril at previous dose.   Lab Results  Component Value Date   CREATININE 0.80 10/18/2016   Lab Results  Component Value Date   NA 136 10/18/2016   K 3.8 10/18/2016   CL 102 10/18/2016   CO2 26 10/18/2016         Obesity (BMI 30-39.9)    I have addressed  BMI and recommended a low glycemic index diet  vs Weight Watchers  utilizing smaller more frequent meals to increase metabolism.  I have also recommended that patient start exercising with a  goal of 30 minutes of aerobic exercise a minimum of 5 days per week. Screening for lipid disorders, thyroid and diabetes twas done today.  Lab Results  Component Value Date   CHOL 210 (H) 10/18/2016   HDL 54.30 10/18/2016   LDLCALC 131 (H) 10/18/2016   LDLDIRECT 145.6 09/18/2013   TRIG 125.0 10/18/2016   CHOLHDL 4 10/18/2016   Lab Results  Component Value Date   TSH 0.64 10/18/2016   Lab Results  Component Value Date   HGBA1C 5.5 10/18/2016           DES exposure in utero    She is overdue for PAP smear  Last one 2013.  She will return next month       Generalized anxiety disorder    Managed with Prozac.  Refills given       Amenorrhea, secondary    With history of menstrual irregularity.   Amenorrhea x 1 year  be PCOS vs menopause.  checking [place] /LH and thyroid. If normal range, will need ultrasound        Other Visit Diagnoses    Fatigue, unspecified type    -  Primary   Relevant Orders   Comprehensive metabolic panel (Completed)   Breast cancer screening       Relevant Orders   MM SCREENING BREAST TOMO BILATERAL   Amenorrhea       Relevant Orders   Follicle stimulating hormone (Completed)   LH (Completed)   TSH (Completed)   Weight gain       Relevant Orders   Lipid panel (Completed)   Hemoglobin A1c (Completed)      I have discontinued Ms. Lim's phentermine and azithromycin. I am also having her start on ergocalciferol. Additionally, I am having her maintain her multivitamin, cetirizine, Melatonin, chlorpheniramine-HYDROcodone, lisinopril, cyclobenzaprine, and FLUoxetine.  Meds ordered this encounter  Medications  . cyclobenzaprine (FLEXERIL) 5 MG tablet    Sig: Take 1 tablet (5 mg total) by mouth 3 (three) times daily as needed for muscle spasms.    Dispense:  90 tablet    Refill:  1  . FLUoxetine (PROZAC) 20 MG capsule    Sig: Take an additional dose on all weekend days    Dispense:  114 capsule    Refill:  3    KEEP ON FILE FOR NEXT  REFILL  . ergocalciferol (VITAMIN D2) 50000 units capsule    Sig: Take 1 capsule (50,000 Units total) by mouth once a week.    Dispense:  12 capsule    Refill:  0  90 day supply    Medications Discontinued During This Encounter  Medication Reason  . phentermine (ADIPEX-P) 37.5 MG tablet   . azithromycin (ZITHROMAX Z-PAK) 250 MG tablet   . cyclobenzaprine (FLEXERIL) 5 MG tablet Reorder  . FLUoxetine (PROZAC) 20 MG capsule Reorder    Follow-up: Return in about 2 months (around 12/18/2016), or CPE with PAP .   Crecencio Mc, MD

## 2016-10-19 ENCOUNTER — Encounter: Payer: Self-pay | Admitting: Internal Medicine

## 2016-10-19 NOTE — Assessment & Plan Note (Signed)
Elevated today due to lapse in medications.  Resume lisinopril at previous dose.   Lab Results  Component Value Date   CREATININE 0.80 10/18/2016   Lab Results  Component Value Date   NA 136 10/18/2016   K 3.8 10/18/2016   CL 102 10/18/2016   CO2 26 10/18/2016

## 2016-10-19 NOTE — Assessment & Plan Note (Signed)
I have addressed  BMI and recommended a low glycemic index diet  vs Weight Watchers  utilizing smaller more frequent meals to increase metabolism.  I have also recommended that patient start exercising with a goal of 30 minutes of aerobic exercise a minimum of 5 days per week. Screening for lipid disorders, thyroid and diabetes twas done today.  Lab Results  Component Value Date   CHOL 210 (H) 10/18/2016   HDL 54.30 10/18/2016   LDLCALC 131 (H) 10/18/2016   LDLDIRECT 145.6 09/18/2013   TRIG 125.0 10/18/2016   CHOLHDL 4 10/18/2016   Lab Results  Component Value Date   TSH 0.64 10/18/2016   Lab Results  Component Value Date   HGBA1C 5.5 10/18/2016

## 2016-10-19 NOTE — Assessment & Plan Note (Signed)
With history of menstrual irregularity.   Amenorrhea x 1 year  be PCOS vs menopause.  checking [place] /LH and thyroid. If normal range, will need ultrasound

## 2016-10-19 NOTE — Assessment & Plan Note (Signed)
She is overdue for PAP smear  Last one 2013.  She will return next month

## 2016-10-19 NOTE — Assessment & Plan Note (Signed)
Managed with Prozac.  Refills given  

## 2016-10-21 DIAGNOSIS — E559 Vitamin D deficiency, unspecified: Secondary | ICD-10-CM | POA: Insufficient documentation

## 2016-10-21 MED ORDER — ERGOCALCIFEROL 1.25 MG (50000 UT) PO CAPS: 50000.0000 [IU] | ORAL_CAPSULE | ORAL | 0 refills | Status: DC

## 2016-10-21 NOTE — Addendum Note (Signed)
Addended by: Crecencio Mc on: 10/21/2016 12:56 PM   Modules accepted: Orders

## 2016-10-21 NOTE — Assessment & Plan Note (Signed)
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

## 2016-10-23 ENCOUNTER — Other Ambulatory Visit: Payer: Self-pay | Admitting: Internal Medicine

## 2016-10-23 NOTE — Telephone Encounter (Signed)
PT called and stated she went to the pharmacy and her medication was not there. Pt needs lisinopril (PRINIVIL,ZESTRIL) 10 MG tablet. Thank you!  Pharmacy - [facility], [place] [address]  Call pt @ [phone]

## 2016-11-23 ENCOUNTER — Ambulatory Visit
Admission: RE | Admit: 2016-11-23 | Discharge: 2016-11-23 | Disposition: A | Discharge status: Home / Self Care | Payer: 59 | Source: Ambulatory Visit | Attending: Internal Medicine | Admitting: Internal Medicine

## 2016-11-23 DIAGNOSIS — Z1239 Encounter for other screening for malignant neoplasm of breast: Secondary | ICD-10-CM

## 2016-11-23 DIAGNOSIS — Z1231 Encounter for screening mammogram for malignant neoplasm of breast: Secondary | ICD-10-CM | POA: Insufficient documentation

## 2016-12-02 ENCOUNTER — Emergency Department
Admission: EM | Admit: 2016-12-02 | Discharge: 2016-12-02 | Disposition: A | Discharge status: Home / Self Care | Payer: PRIVATE HEALTH INSURANCE | Attending: Emergency Medicine | Admitting: Emergency Medicine

## 2016-12-02 ENCOUNTER — Emergency Department: Payer: PRIVATE HEALTH INSURANCE

## 2016-12-02 DIAGNOSIS — I10 Essential (primary) hypertension: Secondary | ICD-10-CM | POA: Insufficient documentation

## 2016-12-02 DIAGNOSIS — Y9389 Activity, other specified: Secondary | ICD-10-CM | POA: Diagnosis not present

## 2016-12-02 DIAGNOSIS — Y92481 Parking lot as the place of occurrence of the external cause: Secondary | ICD-10-CM | POA: Insufficient documentation

## 2016-12-02 DIAGNOSIS — W1839XA Other fall on same level, initial encounter: Secondary | ICD-10-CM | POA: Insufficient documentation

## 2016-12-02 DIAGNOSIS — S99911A Unspecified injury of right ankle, initial encounter: Secondary | ICD-10-CM | POA: Diagnosis present

## 2016-12-02 DIAGNOSIS — Y990 Civilian activity done for income or pay: Secondary | ICD-10-CM | POA: Diagnosis not present

## 2016-12-02 DIAGNOSIS — S93491A Sprain of other ligament of right ankle, initial encounter: Secondary | ICD-10-CM

## 2016-12-02 DIAGNOSIS — Z79899 Other long term (current) drug therapy: Secondary | ICD-10-CM | POA: Insufficient documentation

## 2016-12-02 DIAGNOSIS — S93431A Sprain of tibiofibular ligament of right ankle, initial encounter: Secondary | ICD-10-CM | POA: Diagnosis not present

## 2016-12-02 HISTORY — DX: Major depressive disorder, single episode, unspecified: F32.9

## 2016-12-02 HISTORY — DX: Depression, unspecified: F32.A

## 2016-12-02 NOTE — ED Provider Notes (Signed)
[facility] Provider Note   CSN: [id number] Arrival date & time: 12/02/16  2144     History   Chief Complaint Chief Complaint  Patient presents with  . Ankle Pain  . Fall    HPI Jaydalyn L Boeker is a 50 y.o. female presents to the emergency department for evaluation of right ankle pain. 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 She denies any medial ankle pain. No other injury to her body. She does not feel a pop. Patient has painful weightbearing on the right lower extremity. No numbness or tingling. Pain is moderate.  HPI  Past Medical History:  Diagnosis Date  . Depression   . Hypertension     Patient Active Problem List   Diagnosis Date Noted  . Vitamin D deficiency 10/21/2016  . Other and unspecified hyperlipidemia 09/20/2013  . Amenorrhea, secondary 09/20/2013  . Ankle fracture, right 09/20/2013  . Screening for breast cancer 09/20/2013  . Generalized anxiety disorder 06/19/2013  . Routine general medical examination at a health care facility 09/26/2012  . Hypertension 03/27/2012  . Obesity (BMI 30-39.9) 03/27/2012  . DES exposure in utero 03/27/2012    Past Surgical History:  Procedure Laterality Date  . DILATION AND CURETTAGE OF UTERUS  1989   miscarriage  . exploratory  2000   x 2, ectopic pregnancy x 2, one rupture    OB History    No data available       Home Medications    Prior to Admission medications   Medication Sig Start Date End Date Taking? Authorizing Provider  cetirizine (ZYRTEC) 10 MG tablet Take 10 mg by mouth daily.    Historical Provider, MD  chlorpheniramine-HYDROcodone (TUSSIONEX PENNKINETIC ER) 10-8 MG/5ML SUER Take 5 mLs by mouth every 12 (twelve) hours as needed for cough. Patient not taking: Reported on 10/18/2016 06/07/16   Versie Starks, PA-C  cyclobenzaprine (FLEXERIL) 5 MG tablet  Take 1 tablet (5 mg total) by mouth 3 (three) times daily as needed for muscle spasms. 10/18/16   Crecencio Mc, MD  ergocalciferol (VITAMIN D2) 50000 units capsule Take 1 capsule (50,000 Units total) by mouth once a week. 10/21/16   Crecencio Mc, MD  FLUoxetine (PROZAC) 20 MG capsule Take an additional dose on all weekend days 10/18/16   Crecencio Mc, MD  lisinopril (PRINIVIL,ZESTRIL) 10 MG tablet TAKE 1 TABLET BY MOUTH ONCE DAILY 10/23/16   Crecencio Mc, MD  Melatonin 1 MG CAPS Take 1 capsule by mouth at bedtime as needed.    Historical Provider, MD  Multiple Vitamin (MULTIVITAMIN) tablet Take 1 tablet by mouth daily.    Historical Provider, MD    Family History Family History  Problem Relation Age of Onset  . Hypertension Mother   . Hyperlipidemia Father   . Cancer Paternal Aunt     BLADDER  . Cancer Paternal Uncle     PANCREATIC  . Cancer Maternal Grandmother     BREAST, NOW CANCER FREE  . Dementia Maternal Grandmother   . Cancer Maternal Grandfather     LUNG  . Hypertension Maternal Grandfather     AORTIC ANEURYSM REPAIR  . Cancer Paternal Grandfather     LUNG     Social History Social History  Substance Use Topics  . Smoking status: Never Smoker  . Smokeless tobacco: Never Used  . Alcohol use No  Allergies   Codeine and Ultram [tramadol hcl]   Review of Systems Review of Systems  Musculoskeletal: Positive for arthralgias, gait problem and joint swelling.     Physical Exam Updated Vital Signs BP (!) 126/94 (BP Location: Left Arm)   Pulse (!) 113   Temp 98.1 F (36.7 C) (Oral)   Resp 18   Ht 5\' 4"  (1.626 m)   Wt 129.3 kg   LMP 07/24/2015   SpO2 100%   BMI 48.92 kg/m   Physical Exam  Constitutional: She is oriented to person, place, and time. She appears well-developed and well-nourished.  Presents in a wheelchair, alert and oriented. No distress.  HENT:  Head: Normocephalic and atraumatic.  Eyes: Conjunctivae and EOM are normal. Right  eye exhibits no discharge. Left eye exhibits no discharge.  Neck: Normal range of motion.  Pulmonary/Chest: Effort normal. No respiratory distress.  Musculoskeletal:  Examination of the right ankle shows the patient has lateral soft tissue swelling is nontender over the lateral malleolus. No ecchymosis. She is nontender over the medial malleolus. She has no deltoid ligament tenderness. She has 2+ dorsalis pedis pulse. She is nontender throughout the tarsal bones or fifth metatarsal. She is tender along the right lateral ATFL ligament. Achilles tendon is intact with no tenderness to palpation.  Neurological: She is alert and oriented to person, place, and time.  Skin: Skin is warm.  Psychiatric: She has a normal mood and affect. Her behavior is normal. Judgment and thought content normal.     ED Treatments / Results  Labs (all labs ordered are listed, but only abnormal results are displayed) Labs Reviewed - No data to display  EKG  EKG Interpretation None       Radiology Dg Ankle Complete Right  Result Date: 12/02/2016 CLINICAL DATA:  Fall.  Right ankle pain. EXAM: RIGHT ANKLE - COMPLETE 3+ VIEW COMPARISON:  01/31/2013 right ankle radiographs. FINDINGS: Diffuse right ankle soft tissue swelling. No fracture, subluxation or suspicious focal osseous lesion. Small Achilles and plantar right calcaneal spurs. No radiopaque foreign body. IMPRESSION: Diffuse right ankle soft tissue swelling, with no fracture or subluxation. Electronically Signed   By: Ilona Sorrel M.D.   On: 12/02/2016 22:12    Procedures Procedures (including critical care time) SPLINT APPLICATION Date/Time: 123XX123 PM Authorized by: Feliberto Gottron Consent: Verbal consent obtained. Risks and benefits: risks, benefits and alternatives were discussed Consent given by: patient Splint applied by: ED tech Location details: Right ankle  Splint type: Prefabricated right ankle stirrup  Supplies used: Ankle stirrup    Post-procedure: The splinted body part was neurovascularly unchanged following the procedure. Patient tolerance: Patient tolerated the procedure well with no immediate complications.     Medications Ordered in ED Medications - No data to display   Initial Impression / Assessment and Plan / ED Course  I have reviewed the triage vital signs and the nursing notes.  Pertinent labs & imaging results that were available during my care of the patient were reviewed by me and considered in my medical decision making (see chart for details).  Clinical Course     50 year old female presents for right lateral ankle pain, she rolled her right ankle at work. X-ray showed no evidence of acute bony abnormality, soft tissue swelling present. Exam consistent with lateral ATFL ligament sprain. She is placed into a stirrup splint. She will use a walker at home and progress weightbearing as tolerated. She'll follow-up with orthopedics. Naproxen as needed for pain. She will rest  ice and elevate.  Final Clinical Impressions(s) / ED Diagnoses   Final diagnoses:  Sprain of anterior talofibular ligament of right ankle, initial encounter    New Prescriptions New Prescriptions   No medications on file     Duanne Guess, PA-C 12/02/16 2239    Earleen Newport, MD 12/02/16 [phone]

## 2016-12-02 NOTE — ED Triage Notes (Signed)
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 Pt fall from standing. Right ankle pain since fall. Worse with movement. PT did not hit head. Pt alert and oriented X4, active, cooperative, pt in NAD. RR even and unlabored, color WNL.

## 2016-12-02 NOTE — Discharge Instructions (Signed)
Please elevate ankle and take naproxen 500 mg twice daily with food as needed for pain. Ice ankle 20 minutes every hour for the next 2-3 days. Use a walker to help with ambulation. You can progress weightbearing as tolerated. Follow-up with orthopedics in 5-7 days.

## 2016-12-02 NOTE — ED Notes (Signed)
Pt unable to urinate at this time for [facility] urine drug screen, pt provided 2 cups of water with PA permission and will continue to monitor to obtain drug screen

## 2016-12-02 NOTE — ED Notes (Signed)
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 Pt ankle is pink in color, foot is pink in color. No bruising noted, previous fracture to R ankle. Pt has ankle propped on bed, ice pack applied. States unbearable to bear weight.

## 2016-12-05 DIAGNOSIS — H5213 Myopia, bilateral: Secondary | ICD-10-CM | POA: Diagnosis not present

## 2016-12-19 ENCOUNTER — Encounter: Payer: Self-pay | Admitting: Internal Medicine

## 2016-12-26 ENCOUNTER — Other Ambulatory Visit (HOSPITAL_COMMUNITY)
Admission: RE | Admit: 2016-12-26 | Discharge: 2016-12-26 | Disposition: A | Discharge status: Home / Self Care | Payer: 59 | Source: Ambulatory Visit | Attending: Internal Medicine | Admitting: Internal Medicine

## 2016-12-26 ENCOUNTER — Ambulatory Visit (INDEPENDENT_AMBULATORY_CARE_PROVIDER_SITE_OTHER): Payer: 59 | Admitting: Internal Medicine

## 2016-12-26 VITALS — BP 122/78 | HR 74 | Temp 97.8°F | Resp 12 | Ht 64.0 in | Wt 289.5 lb

## 2016-12-26 DIAGNOSIS — I10 Essential (primary) hypertension: Secondary | ICD-10-CM

## 2016-12-26 DIAGNOSIS — Z0000 Encounter for general adult medical examination without abnormal findings: Secondary | ICD-10-CM

## 2016-12-26 DIAGNOSIS — E559 Vitamin D deficiency, unspecified: Secondary | ICD-10-CM

## 2016-12-26 DIAGNOSIS — Z124 Encounter for screening for malignant neoplasm of cervix: Secondary | ICD-10-CM

## 2016-12-26 DIAGNOSIS — Z9189 Other specified personal risk factors, not elsewhere classified: Secondary | ICD-10-CM

## 2016-12-26 DIAGNOSIS — Z01419 Encounter for gynecological examination (general) (routine) without abnormal findings: Secondary | ICD-10-CM | POA: Diagnosis not present

## 2016-12-26 DIAGNOSIS — Z1151 Encounter for screening for human papillomavirus (HPV): Secondary | ICD-10-CM | POA: Insufficient documentation

## 2016-12-26 DIAGNOSIS — F411 Generalized anxiety disorder: Secondary | ICD-10-CM

## 2016-12-26 NOTE — Progress Notes (Addendum)
Patient ID: Laura Gray, female    DOB: [birth year]  Age: 51 y.o. MRN: [id number]  The patient is here for annual preventive  examination and management of other chronic and acute problems.   Due for PAP  Normal 3D Mammogram Nov 23 2016 Colon cancer screening discussed    The risk factors are reflected in the social history.  The roster of all physicians providing medical care to patient - is listed in the Snapshot section of the chart.  Activities of daily living:  The patient is 100% independent in all ADLs: dressing, toileting, feeding as well as independent mobility  Home safety : The patient has smoke detectors in the home. They wear seatbelts.  There are no firearms at home. There is no violence in the home.   There is no risks for hepatitis, STDs or HIV. There is no   history of blood transfusion. They have no travel history to infectious disease endemic areas of the world.  The patient has seen their dentist in the last six month. They have seen their eye doctor in the last year.    Discussed the need for sun protection: hats, long sleeves and use of sunscreen if there is significant sun exposure.   Diet: the importance of a healthy diet is discussed. They do not  have a healthy diet.  The benefits of regular aerobic exercise were discussed. She is not exercising   Depression screen: there are no signs or vegative symptoms of depression- irritability, change in appetite, anhedonia, sadness/tearfullness.  The following portions of the patient's history were reviewed and updated as appropriate: allergies, current medications, past family history, past medical history,  past surgical history, past social history  and problem list.  Visual acuity was not assessed per patient preference since she has regular follow up with her ophthalmologist. Hearing and body mass index were assessed and reviewed.   During the course of the visit the patient was educated and counseled about  appropriate screening and preventive services including : fall prevention , diabetes screening, nutrition counseling, colorectal cancer screening, and recommended immunizations.    CC: The primary encounter diagnosis was Cervical cancer screening. Diagnoses of Encounter for preventive health examination, Vitamin D deficiency, Essential hypertension, Generalized anxiety disorder, and DES exposure in utero were also pertinent to this visit.  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  New shoes and rainy conditions.   PREVIOUS FIBULAR FRACTURE same side,  Workmen's comp issue. Seeing PA  Who hgave her an ASO brace to wear for 6 weeks .  Took one night off of work.  Not too painful despite 12 hour shifts,    ADDRESSED WEIGHT GAIN . SHE HAS GAINED 50 LBS SINCE April  2016  History Laura Gray has a past medical history of Depression and Hypertension.   She has a past surgical history that includes Dilation and curettage of uterus (1989) and exploratory (2000).   Her family history includes Cancer in her maternal grandfather, maternal grandmother, paternal aunt, paternal grandfather, and paternal uncle; Dementia in her maternal grandmother; Hyperlipidemia in her father; Hypertension in her maternal grandfather and mother.She reports that she has never smoked. She has never used smokeless tobacco. She reports that she does not drink alcohol or use drugs.  Outpatient Medications Prior to Visit  Medication Sig Dispense Refill  . cetirizine (ZYRTEC) 10 MG tablet Take 10 mg by mouth daily.    Marland Kitchen  cyclobenzaprine (FLEXERIL) 5 MG tablet Take 1 tablet (5 mg total) by mouth 3 (three) times daily as needed for muscle spasms. 90 tablet 1  . ergocalciferol (VITAMIN D2) 50000 units capsule Take 1 capsule (50,000 Units total) by mouth once a week. 12 capsule 0  . FLUoxetine (PROZAC) 20 MG capsule Take an additional dose on all weekend days 114 capsule 3  .  lisinopril (PRINIVIL,ZESTRIL) 10 MG tablet TAKE 1 TABLET BY MOUTH ONCE DAILY 90 tablet 3  . Melatonin 1 MG CAPS Take 1 capsule by mouth at bedtime as needed.    . Multiple Vitamin (MULTIVITAMIN) tablet Take 1 tablet by mouth daily.    . chlorpheniramine-HYDROcodone (TUSSIONEX PENNKINETIC ER) 10-8 MG/5ML SUER Take 5 mLs by mouth every 12 (twelve) hours as needed for cough. (Patient not taking: Reported on 12/26/2016) 150 mL 0   No facility-administered medications prior to visit.     Review of Systems   Patient denies headache, fevers, malaise, unintentional weight loss, skin rash, eye pain, sinus congestion and sinus pain, sore throat, dysphagia,  hemoptysis , cough, dyspnea, wheezing, chest pain, palpitations, orthopnea, edema, abdominal pain, nausea, melena, diarrhea, constipation, flank pain, dysuria, hematuria, urinary  Frequency, nocturia, numbness, tingling, seizures,  Focal weakness, Loss of consciousness,  Tremor, insomnia, depression, anxiety, and suicidal ideation.     Objective:  BP 122/78   Pulse 74   Temp 97.8 F (36.6 C) (Oral)   Resp 12   Ht 5\' 4"  (1.626 m)   Wt 289 lb 8 oz (131.3 kg)   LMP 07/24/2015   SpO2 98%   BMI 49.69 kg/m   Physical Exam   General Appearance:    Morbidly obese, alert, cooperative, no distress, appears stated age  Head:    Normocephalic, without obvious abnormality, atraumatic  Eyes:    PERRL, conjunctiva/corneas clear, EOM's intact, fundi    benign, both eyes  Ears:    Normal TM's and external ear canals, both ears  Nose:   Nares normal, septum midline, mucosa normal, no drainage    or sinus tenderness  Throat:   Lips, mucosa, and tongue normal; teeth and gums normal  Neck:   Supple, symmetrical, trachea midline, no adenopathy;    thyroid:  no enlargement/tenderness/nodules; no carotid   bruit or JVD  Back:     Symmetric, no curvature, ROM normal, no CVA tenderness  Lungs:     Clear to auscultation bilaterally, respirations unlabored   Chest Wall:    No tenderness or deformity   Heart:    Regular rate and rhythm, S1 and S2 normal, no murmur, rub   or gallop  Breast Exam:    No tenderness, masses, or nipple abnormality  Abdomen:     Soft, non-tender, bowel sounds active all four quadrants,    no masses, no organomegaly  Genitalia:    Pelvic: cervix normal in appearance, external genitalia normal, no adnexal masses or tenderness, no cervical motion tenderness, rectovaginal septum normal, uterus normal size, shape, and consistency and vagina normal without discharge  Extremities:   Extremities normal, atraumatic, no cyanosis or edema  Pulses:   2+ and symmetric all extremities  Skin:   Skin color, texture, turgor normal, no rashes or lesions  Lymph nodes:   Cervical, supraclavicular, and axillary nodes normal  Neurologic:   CNII-XII intact, normal strength, sensation and reflexes    throughout      Assessment & Plan:   Problem List Items Addressed This Visit    DES exposure in  utero    PAP SMEAR DONE       Encounter for preventive health examination    Annual comprehensive preventive exam was done as well as an evaluation and management of chronic conditions .  During the course of the visit the patient was educated and counseled about appropriate screening and preventive services including :  diabetes screening, lipid analysis with projected  10 year  risk for CAD , nutrition counseling, breast, cervical and colorectal cancer screening, and recommended immunizations.  Printed recommendations for health maintenance screenings was given      Generalized anxiety disorder    Managed with Prozac.  Refills given       Hypertension    Well controlled on current regimen. Renal function stable, no changes today.  Lab Results  Component Value Date   CREATININE 0.80 10/18/2016   Lab Results  Component Value Date   NA 136 10/18/2016   K 3.8 10/18/2016   CL 102 10/18/2016   CO2 26 10/18/2016         Vitamin D  deficiency    Treated with drisdol       Other Visit Diagnoses    Cervical cancer screening    -  Primary   Relevant Orders   Cytology - PAP (Completed)      I am having Laura Gray maintain her multivitamin, cetirizine, Melatonin, chlorpheniramine-HYDROcodone, cyclobenzaprine, FLUoxetine, ergocalciferol, and lisinopril.  No orders of the defined types were placed in this encounter.   There are no discontinued medications.  Follow-up: No Follow-up on file.   Crecencio Mc, MD

## 2016-12-26 NOTE — Patient Instructions (Signed)
Your vitamin D  Was very low in October.   Once you finish your Megadose of Vitamin D, start taking 2000 IUs daily  Of D3 for the winter months. . Low Vitamin D can increase your risk of weak bones and fractures and interfere with your body's ability to absorb the calcium in your diet  I have recommended Cologuard as your screening tool for colon CA (unless you have met your deductible)   Check with your insurance about coverage.

## 2016-12-26 NOTE — Progress Notes (Signed)
Pre-visit discussion using our clinic review tool. No additional management support is needed unless otherwise documented below in the visit note.  

## 2016-12-27 NOTE — Assessment & Plan Note (Signed)
Annual comprehensive preventive exam was done as well as an evaluation and management of chronic conditions .  During the course of the visit the patient was educated and counseled about appropriate screening and preventive services including :  diabetes screening, lipid analysis with projected  10 year  risk for CAD , nutrition counseling, breast, cervical and colorectal cancer screening, and recommended immunizations.  Printed recommendations for health maintenance screenings was given 

## 2016-12-27 NOTE — Assessment & Plan Note (Signed)
Well controlled on current regimen. Renal function stable, no changes today.  Lab Results  Component Value Date   CREATININE 0.80 10/18/2016   Lab Results  Component Value Date   NA 136 10/18/2016   K 3.8 10/18/2016   CL 102 10/18/2016   CO2 26 10/18/2016

## 2016-12-27 NOTE — Assessment & Plan Note (Addendum)
PAP SMEAR DONE

## 2016-12-27 NOTE — Assessment & Plan Note (Signed)
Managed with Prozac.  Refills given  

## 2016-12-27 NOTE — Assessment & Plan Note (Signed)
Treated with drisdol

## 2016-12-28 ENCOUNTER — Encounter: Payer: Self-pay | Admitting: Internal Medicine

## 2016-12-28 LAB — CYTOLOGY - PAP
DIAGNOSIS: NEGATIVE
HPV (WINDOPATH): NOT DETECTED

## 2017-07-30 IMAGING — US US TRANSVAGINAL NON-OB
1 series · 14 of 25 positions shown · non-contrast
Comparison: No similar prior exam is available at this institution
for comparison or on [HOSPITAL] PACS.

CLINICAL DATA: Pelvic pain and bloating for 1 month

EXAM:
TRANSABDOMINAL AND TRANSVAGINAL ULTRASOUND OF PELVIS
TECHNIQUE: Both transabdominal and transvaginal ultrasound examinations of the
pelvis were performed. Transabdominal technique was performed for
global imaging of the pelvis including uterus, ovaries, adnexal
regions, and pelvic cul-de-sac. It was necessary to proceed with
endovaginal exam following the transabdominal exam to visualize the
endometrium and ovaries.

[Series 1: us transvaginal non-ob · 0.28mm/px · 14 of 114 slices shown]
[im 1/114]
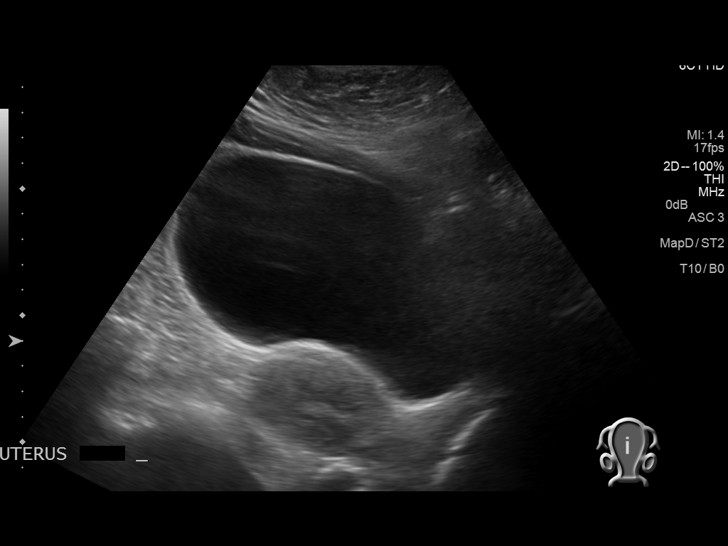
[im 10/114]
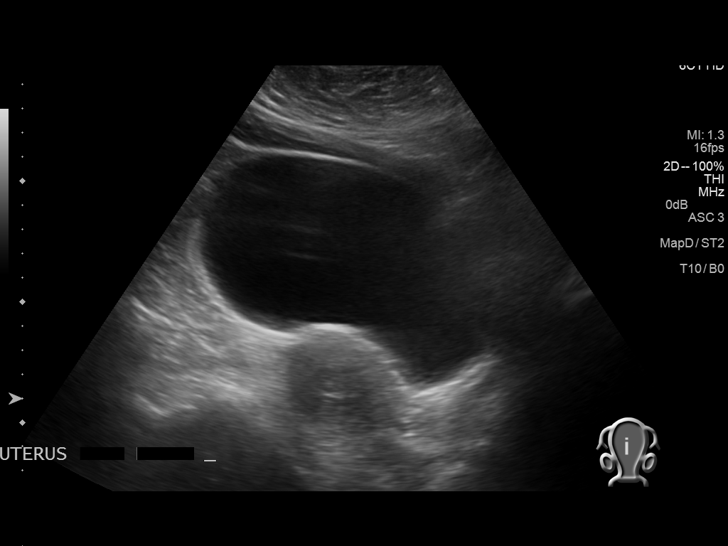
[im 19/114]
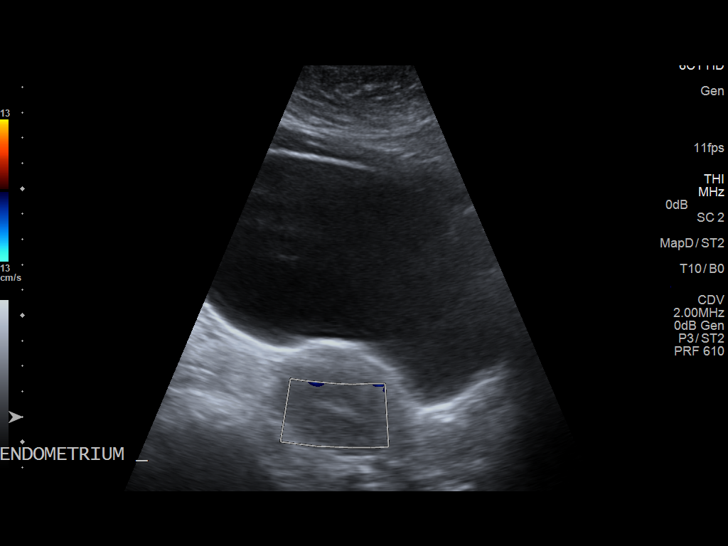
[im 29/114]
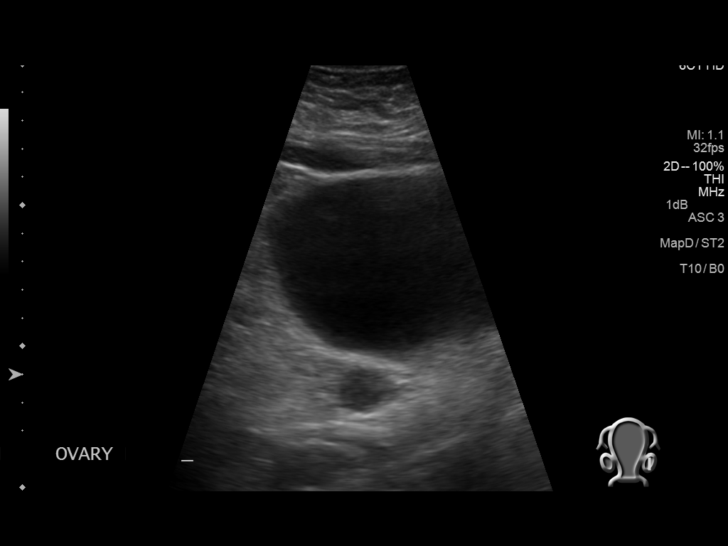
[im 38/114]
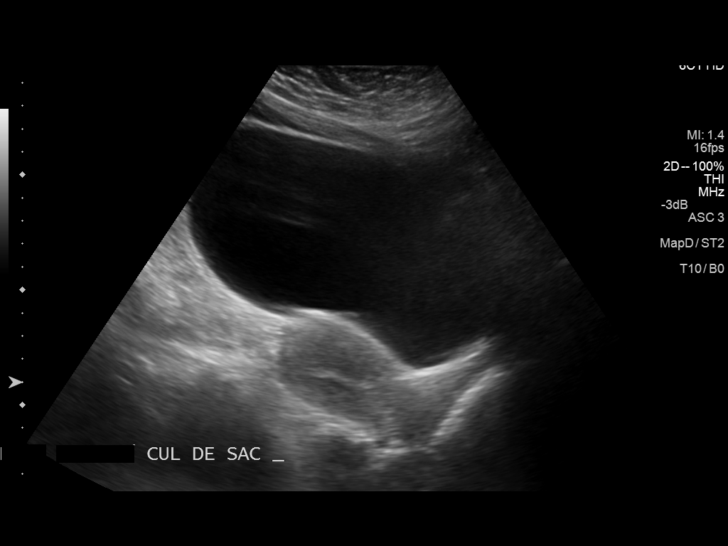
[im 43/114]
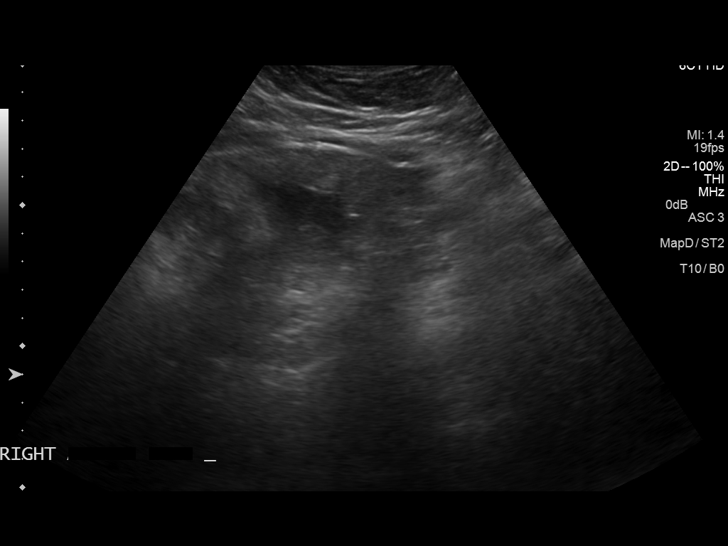
[im 52/114]
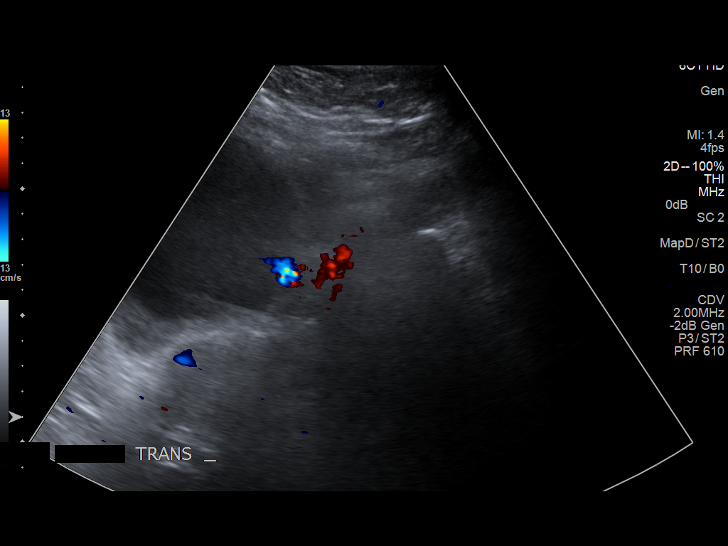
[im 62/114]
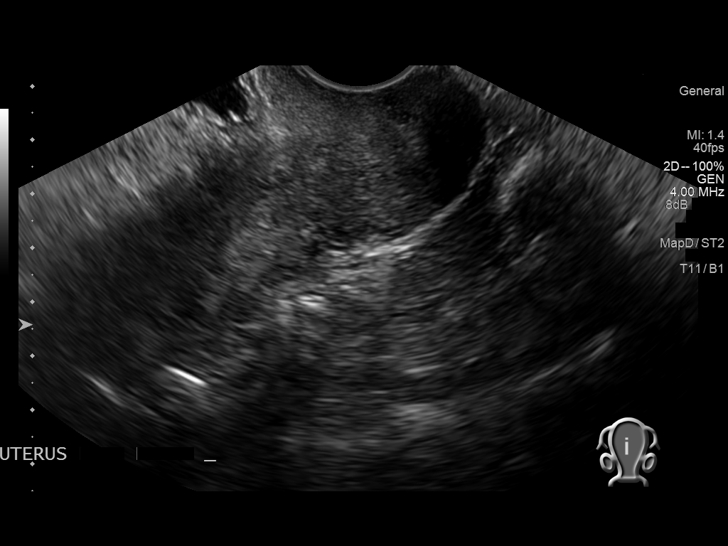
[im 71/114]
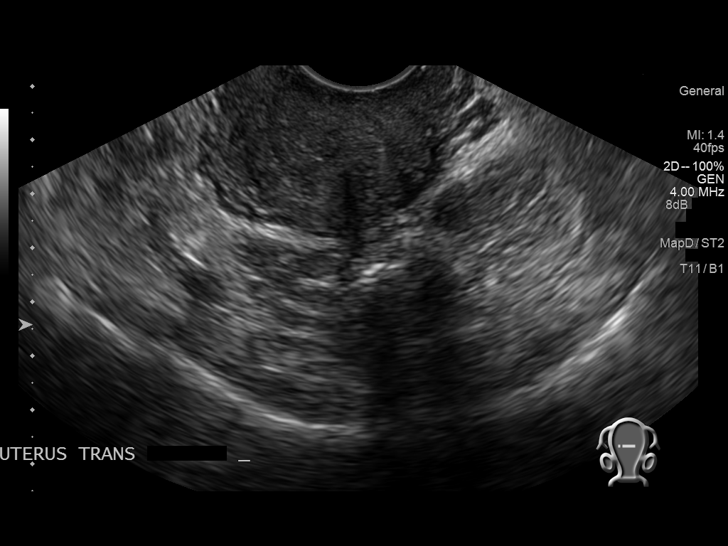
[im 76/114]
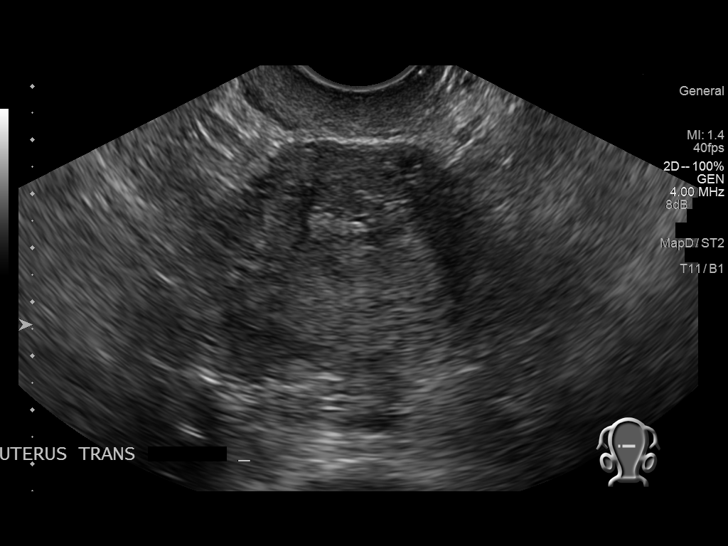
[im 85/114]
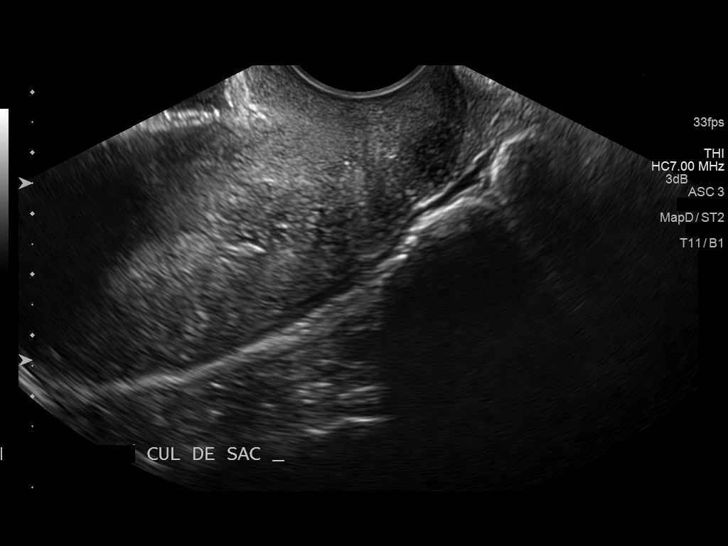
[im 95/114]
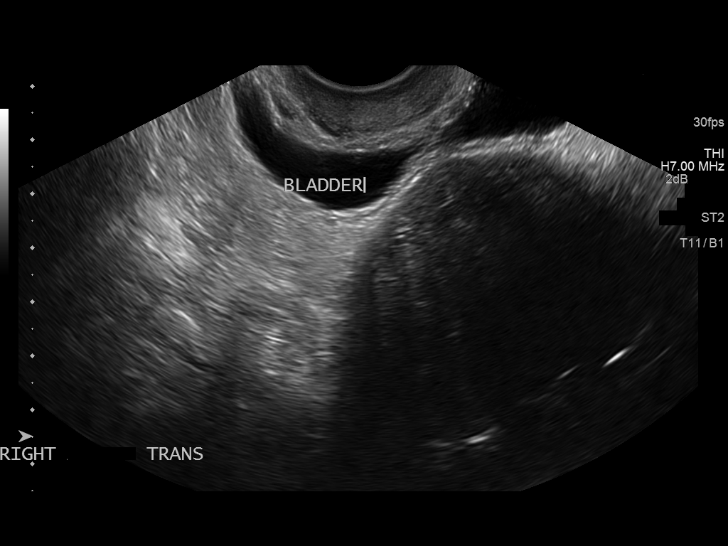
[im 104/114]
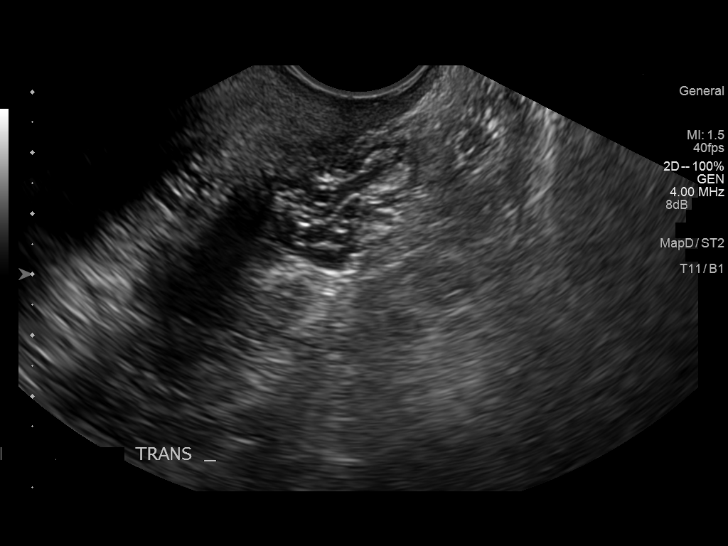
[im 114/114]
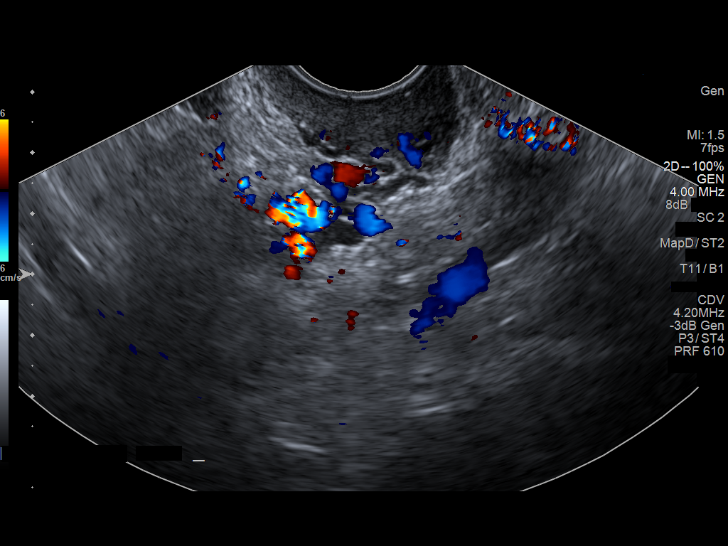

[14 of 25 positions shown; findings below may reference images not displayed]

FINDINGS: Uterus

Measurements: 8.6 x 4.7 x 4.4 cm, anteverted, anteflexed. No
fibroids or other mass visualized.

Endometrium

Thickness: 4 mm.  No focal abnormality visualized.

Right ovary

Measurements: 2.5 x 1.7 x 1.2 cm, seen transabdominally only. Normal
appearance/no adnexal mass.

Left ovary

Measurements: 2.3 x 2.1 x 1.6 cm, seen transabdominally only. Normal
appearance/no adnexal mass.

Other findings

Trace free fluid identified
IMPRESSION: Normal exam.

## 2017-10-25 ENCOUNTER — Other Ambulatory Visit: Payer: Self-pay | Admitting: Internal Medicine

## 2018-01-08 ENCOUNTER — Encounter: Payer: Self-pay | Admitting: Internal Medicine

## 2018-01-16 ENCOUNTER — Encounter: Payer: Self-pay | Admitting: Internal Medicine

## 2018-01-16 ENCOUNTER — Ambulatory Visit: Payer: 59 | Admitting: Internal Medicine

## 2018-01-16 VITALS — BP 112/76 | HR 85 | Temp 98.0°F | Resp 15 | Ht 64.0 in | Wt 278.8 lb

## 2018-01-16 DIAGNOSIS — Z0000 Encounter for general adult medical examination without abnormal findings: Secondary | ICD-10-CM

## 2018-01-16 DIAGNOSIS — Z1231 Encounter for screening mammogram for malignant neoplasm of breast: Secondary | ICD-10-CM | POA: Diagnosis not present

## 2018-01-16 DIAGNOSIS — R635 Abnormal weight gain: Secondary | ICD-10-CM | POA: Diagnosis not present

## 2018-01-16 DIAGNOSIS — Z9189 Other specified personal risk factors, not elsewhere classified: Secondary | ICD-10-CM | POA: Diagnosis not present

## 2018-01-16 DIAGNOSIS — J40 Bronchitis, not specified as acute or chronic: Secondary | ICD-10-CM

## 2018-01-16 DIAGNOSIS — E669 Obesity, unspecified: Secondary | ICD-10-CM

## 2018-01-16 DIAGNOSIS — Z1239 Encounter for other screening for malignant neoplasm of breast: Secondary | ICD-10-CM

## 2018-01-16 DIAGNOSIS — I10 Essential (primary) hypertension: Secondary | ICD-10-CM

## 2018-01-16 LAB — TSH: TSH: 0.62 u[IU]/mL (ref 0.35–4.50)

## 2018-01-16 LAB — COMPREHENSIVE METABOLIC PANEL
ALT: 33 U/L (ref 0–35)
AST: 21 U/L (ref 0–37)
Albumin: 4.2 g/dL (ref 3.5–5.2)
Alkaline Phosphatase: 83 U/L (ref 39–117)
BUN: 11 mg/dL (ref 6–23)
CHLORIDE: 102 meq/L (ref 96–112)
CO2: 20 meq/L (ref 19–32)
Calcium: 9.6 mg/dL (ref 8.4–10.5)
Creatinine, Ser: 0.89 mg/dL (ref 0.40–1.20)
GFR: 70.88 mL/min (ref >60.00)
GLUCOSE: 118 mg/dL — AB (ref 70–99)
POTASSIUM: 3.9 meq/L (ref 3.5–5.1)
SODIUM: 138 meq/L (ref 135–145)
TOTAL PROTEIN: 7.8 g/dL (ref 6.0–8.3)
Total Bilirubin: 0.8 mg/dL (ref 0.2–1.2)

## 2018-01-16 LAB — LIPID PANEL
CHOL/HDL RATIO: 3
Cholesterol: 189 mg/dL (ref 0–200)
HDL: 56.3 mg/dL (ref >39.00)
LDL CALC: 115 mg/dL — AB (ref 0–99)
NONHDL: 132.58
Triglycerides: 89 mg/dL (ref 0.0–149.0)
VLDL: 17.8 mg/dL (ref 0.0–40.0)

## 2018-01-16 LAB — HEMOGLOBIN A1C: HEMOGLOBIN A1C: 5.5 % (ref 4.6–6.5)

## 2018-01-16 MED ORDER — PREDNISONE 10 MG PO TABS: ORAL_TABLET | ORAL | 0 refills | Status: DC

## 2018-01-16 MED ORDER — LISINOPRIL 10 MG PO TABS: 10.0000 mg | ORAL_TABLET | Freq: Every day | ORAL | 1 refills | Status: DC

## 2018-01-16 MED ORDER — LEVOFLOXACIN 500 MG PO TABS: 500.0000 mg | ORAL_TABLET | Freq: Every day | ORAL | 0 refills | Status: DC

## 2018-01-16 MED ORDER — ALBUTEROL SULFATE HFA 108 (90 BASE) MCG/ACT IN AERS: 2.0000 | INHALATION_SPRAY | Freq: Four times a day (QID) | RESPIRATORY_TRACT | 0 refills | Status: DC | PRN

## 2018-01-16 MED ORDER — FLUOXETINE HCL 20 MG PO CAPS: ORAL_CAPSULE | ORAL | 1 refills | Status: DC

## 2018-01-16 NOTE — Progress Notes (Signed)
Patient ID: Laura Gray, female    DOB: [birth year]  Age: 52 y.o. MRN: [id number]  The patient is here for annual  Preventive examination and management of other chronic and acute problems.  Last seen January 2018  Wants to do cologuard  For colon ca screening  Mammogram due  PAP normal Jan 2018  The risk factors are reflected in the social history.  The roster of all physicians providing medical care to patient - is listed in the Snapshot section of the chart.  Activities of daily living:  The patient is 100% independent in all ADLs: dressing, toileting, feeding as well as independent mobility  Home safety : The patient has smoke detectors in the home. They wear seatbelts.  There are no firearms at home. There is no violence in the home.   There is no risks for hepatitis, STDs or HIV. There is no   history of blood transfusion. They have no travel history to infectious disease endemic areas of the world.  The patient has seen their dentist in the last six month. They have seen their eye doctor in the last year. T  They do not  have excessive sun exposure. Discussed the need for sun protection: hats, long sleeves and use of sunscreen if there is significant sun exposure.   Diet: the importance of a healthy diet is discussed. They do have a healthy diet.  The benefits of regular aerobic exercise were discussed. She does not exercise regularly    Depression screen: there are no signs or vegative symptoms of depression- irritability, change in appetite, anhedonia, sadness/tearfullness.  The following portions of the patient's history were reviewed and updated as appropriate: allergies, current medications, past family history, past medical history,  past surgical history, past social history  and problem list.  Visual acuity was not assessed per patient preference since she has regular follow up with her ophthalmologist. Hearing and body mass index were assessed and reviewed.   During the  course of the visit the patient was educated and counseled about appropriate screening and preventive services including : fall prevention , diabetes screening, nutrition counseling, colorectal cancer screening, and recommended immunizations.    CC: The primary encounter diagnosis was Weight gain. Diagnoses of Essential hypertension, Breast cancer screening, Bronchitis, DES exposure in utero, Encounter for preventive health examination, and Obesity (BMI 30-39.9) were also pertinent to this visit.   persistent non productive cough for the past 3 weeks.  No fevers, occasionally notes wheezing.  Some persistent sinus congestion and mild facial  Tenderness but denies pain .    History Sherel has a past medical history of Depression and Hypertension.   She has a past surgical history that includes Dilation and curettage of uterus (1989) and exploratory (2000).   Her family history includes Cancer in her maternal grandfather, maternal grandmother, paternal aunt, paternal grandfather, and paternal uncle; Dementia in her maternal grandmother; Hyperlipidemia in her father; Hypertension in her maternal grandfather and mother.She reports that  has never smoked. she has never used smokeless tobacco. She reports that she does not drink alcohol or use drugs.  Outpatient Medications Prior to Visit  Medication Sig Dispense Refill  . cetirizine (ZYRTEC) 10 MG tablet Take 10 mg by mouth daily.    . cyclobenzaprine (FLEXERIL) 5 MG tablet Take 1 tablet (5 mg total) by mouth 3 (three) times daily as needed for muscle spasms. 90 tablet 1  . Melatonin 1 MG CAPS Take 1 capsule by mouth at bedtime as  needed.    . Multiple Vitamin (MULTIVITAMIN) tablet Take 1 tablet by mouth daily.    Marland Kitchen FLUoxetine (PROZAC) 20 MG capsule TAKE ONE CAPSULE BY MOUTH DAILY AND TAKE AN ADDITIONAL DOSE ON ALL WEEKEND DAYS 114 capsule 3  . lisinopril (PRINIVIL,ZESTRIL) 10 MG tablet TAKE 1 TABLET BY MOUTH ONCE DAILY 90 tablet 3  .  chlorpheniramine-HYDROcodone (TUSSIONEX PENNKINETIC ER) 10-8 MG/5ML SUER Take 5 mLs by mouth every 12 (twelve) hours as needed for cough. (Patient not taking: Reported on 12/26/2016) 150 mL 0  . ergocalciferol (VITAMIN D2) 50000 units capsule Take 1 capsule (50,000 Units total) by mouth once a week. (Patient not taking: Reported on 01/16/2018) 12 capsule 0   No facility-administered medications prior to visit.     Review of Systems   Patient denies headache, fevers, malaise, unintentional weight loss, skin rash, eye pain, sinus congestion and sinus pain, sore throat, dysphagia,  hemoptysis , cough, dyspnea, wheezing, chest pain, palpitations, orthopnea, edema, abdominal pain, nausea, melena, diarrhea, constipation, flank pain, dysuria, hematuria, urinary  Frequency, nocturia, numbness, tingling, seizures,  Focal weakness, Loss of consciousness,  Tremor, insomnia, depression, anxiety, and suicidal ideation.     Objective:  BP 112/76 (BP Location: Left Arm, Patient Position: Sitting, Cuff Size: Large)   Pulse 85   Temp 98 F (36.7 C) (Oral)   Resp 15   Ht 5\' 4"  (1.626 m)   Wt 278 lb 12.8 oz (126.5 kg)   LMP 07/24/2015   SpO2 96%   BMI 47.86 kg/m   Physical Exam   General appearance: alert, cooperative and appears stated age Head: Normocephalic, without obvious abnormality, atraumatic Eyes: conjunctivae/corneas clear. PERRL, EOM's intact. Fundi benign. Ears: normal TM's and external ear canals both ears Nose: Nares normal. Septum midline. Mucosa normal. No drainage or sinus tenderness. Throat: lips, mucosa, and tongue normal; teeth and gums normal Neck: no adenopathy, no carotid bruit, no JVD, supple, symmetrical, trachea midline and thyroid not enlarged, symmetric, no tenderness/mass/nodules Lungs: clear to auscultation bilaterally Breasts: normal appearance, no masses or tenderness Heart: regular rate and rhythm, S1, S2 normal, no murmur, click, rub or gallop Abdomen: soft,  non-tender; bowel sounds normal; no masses,  no organomegaly Extremities: extremities normal, atraumatic, no cyanosis or edema Pulses: 2+ and symmetric Skin: Skin color, texture, turgor normal. No rashes or lesions Neurologic: Alert and oriented X 3, normal strength and tone. Normal symmetric reflexes. Normal coordination and gait.      Assessment & Plan:   Problem List Items Addressed This Visit    Bronchitis    She is wheezing on exam,  Mildly.  Prednisone taper.  Add abx and MDI if no improvement in 48 hours      DES exposure in utero    PAP smear was normal Jan 2018 and she has deferred exam today       Encounter for preventive health examination    Annual comprehensive preventive exam was done as well as an evaluation and management of chronic conditions .  During the course of the visit the patient was educated and counseled about appropriate screening and preventive services including :  diabetes screening, lipid analysis with projected  10 year  risk for CAD , nutrition counseling, breast, cervical and colorectal cancer screening, and recommended immunizations.  Printed recommendations for health maintenance screenings was given      Hypertension    Well controlled on current regimen. Renal function stable, no changes today.  Lab Results  Component Value Date  CREATININE 0.89 01/16/2018   Lab Results  Component Value Date   NA 138 01/16/2018   K 3.9 01/16/2018   CL 102 01/16/2018   CO2 20 01/16/2018         Relevant Medications   lisinopril (PRINIVIL,ZESTRIL) 10 MG tablet   Other Relevant Orders   Microalbumin / creatinine urine ratio (Completed)   Obesity (BMI 30-39.9)    I have addressed  BMI and recommended a low glycemic index diet  vs Weight Watchers  utilizing smaller more frequent meals to increase metabolism.  I have also recommended that patient start exercising with a goal of 30 minutes of aerobic exercise a minimum of 5 days per week. Screening for  lipid disorders, thyroid and diabetes was done today.  Lab Results  Component Value Date   CHOL 189 01/16/2018   HDL 56.30 01/16/2018   LDLCALC 115 (H) 01/16/2018   LDLDIRECT 145.6 09/18/2013   TRIG 89.0 01/16/2018   CHOLHDL 3 01/16/2018   Lab Results  Component Value Date   TSH 0.62 01/16/2018   Lab Results  Component Value Date   HGBA1C 5.5 01/16/2018            Other Visit Diagnoses    Weight gain    -  Primary   Relevant Orders   Hemoglobin A1c (Completed)   Comprehensive metabolic panel (Completed)   Lipid panel (Completed)   TSH (Completed)   Breast cancer screening       Relevant Orders   MM DIGITAL SCREENING BILATERAL      I have discontinued Kaleiyah L. Tritch's chlorpheniramine-HYDROcodone and ergocalciferol. I have also changed her lisinopril. Additionally, I am having her start on predniSONE, levofloxacin, and albuterol. Lastly, I am having her maintain her multivitamin, cetirizine, Melatonin, cyclobenzaprine, and FLUoxetine.  Meds ordered this encounter  Medications  . FLUoxetine (PROZAC) 20 MG capsule    Sig: TAKE ONE CAPSULE BY MOUTH DAILY AND TAKE AN ADDITIONAL DOSE ON ALL WEEKEND DAYS    Dispense:  114 capsule    Refill:  1  . lisinopril (PRINIVIL,ZESTRIL) 10 MG tablet    Sig: Take 1 tablet (10 mg total) by mouth daily.    Dispense:  90 tablet    Refill:  1  . predniSONE (DELTASONE) 10 MG tablet    Sig: 6 tablets on Day 1 , then reduce by 1 tablet daily until gone    Dispense:  21 tablet    Refill:  0  . levofloxacin (LEVAQUIN) 500 MG tablet    Sig: Take 1 tablet (500 mg total) by mouth daily.    Dispense:  7 tablet    Refill:  0  . albuterol (PROVENTIL HFA;VENTOLIN HFA) 108 (90 Base) MCG/ACT inhaler    Sig: Inhale 2 puffs into the lungs every 6 (six) hours as needed for wheezing or shortness of breath.    Dispense:  1 Inhaler    Refill:  0    Medications Discontinued During This Encounter  Medication Reason  .  chlorpheniramine-HYDROcodone (TUSSIONEX PENNKINETIC ER) 10-8 MG/5ML SUER Completed Course  . ergocalciferol (VITAMIN D2) 50000 units capsule Completed Course  . FLUoxetine (PROZAC) 20 MG capsule Reorder  . lisinopril (PRINIVIL,ZESTRIL) 10 MG tablet Reorder    Follow-up: Return in about 6 months (around 07/16/2018).   Crecencio Mc, MD

## 2018-01-16 NOTE — Patient Instructions (Signed)
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   We will order the cologuard and it will be delivered to your door  Mammogram to be ordered as well    Health Maintenance, Female Adopting a healthy lifestyle and getting preventive care can go a long way to promote health and wellness. Talk with your health care provider about what schedule of regular examinations is right for you. This is a good chance for you to check in with your provider about disease prevention and staying healthy. In between checkups, there are plenty of things you can do on your own. Experts have done a lot of research about which lifestyle changes and preventive measures are most likely to keep you healthy. Ask your health care provider for more information. Weight and diet Eat a healthy diet  Be sure to include plenty of vegetables, fruits, low-fat dairy products, and lean protein.  Do not eat a lot of foods high in solid fats, added sugars, or salt.  Get regular exercise. This is one of the most important things you can do for your health. ? Most adults should exercise for at least 150 minutes each week. The exercise should increase your heart rate and make you sweat (moderate-intensity exercise). ? Most adults should also do strengthening exercises at least twice a week. This is in addition to the moderate-intensity exercise.  Maintain a healthy weight  Body mass index (BMI) is a measurement that can be used to identify possible weight problems. It estimates body fat based on height and weight. Your health care  provider can help determine your BMI and help you achieve or maintain a healthy weight.  For females 34 years of age and older: ? A BMI below 18.5 is considered underweight. ? A BMI of 18.5 to 24.9 is normal. ? A BMI of 25 to 29.9 is considered overweight. ? A BMI of 30 and above is considered obese.  Watch levels of cholesterol and blood lipids  You should start having your blood tested for lipids and cholesterol at 52 years of age, then have this test every 5 years.  You may need to have your cholesterol levels checked more often if: ? Your lipid or cholesterol levels are high. ? You are older than 52 years of age. ? You are at high risk for heart disease.  Cancer screening Lung Cancer  Lung cancer screening is recommended for adults 61-71 years old who are at high risk for lung cancer because of a history of smoking.  A yearly low-dose CT scan of the lungs is recommended for people who: ? Currently smoke. ? Have quit within the past 15 years. ? Have at least a 30-pack-year history of smoking. A pack year is smoking an average of one pack of cigarettes a day for 1 year.  Yearly screening should continue until it has been 15 years since you quit.  Yearly screening should stop if you develop a health problem that would prevent you from having lung cancer treatment.  Breast Cancer  Practice breast self-awareness. This  means understanding how your breasts normally appear and feel.  It also means doing regular breast self-exams. Let your health care provider know about any changes, no matter how small.  If you are in your 20s or 30s, you should have a clinical breast exam (CBE) by a health care provider every 1-3 years as part of a regular health exam.  If you are 57 or older, have a CBE every year. Also consider having a breast X-ray (mammogram) every year.  If you have a family history of breast cancer, talk to your health care provider about genetic screening.  If you are  at high risk for breast cancer, talk to your health care provider about having an MRI and a mammogram every year.  Breast cancer gene (BRCA) assessment is recommended for women who have family members with BRCA-related cancers. BRCA-related cancers include: ? Breast. ? Ovarian. ? Tubal. ? Peritoneal cancers.  Results of the assessment will determine the need for genetic counseling and BRCA1 and BRCA2 testing.  Cervical Cancer Your health care provider Placke recommend that you be screened regularly for cancer of the pelvic organs (ovaries, uterus, and vagina). This screening involves a pelvic examination, including checking for microscopic changes to the surface of your cervix (Pap test). You Schnoebelen be encouraged to have this screening done every 3 years, beginning at age 42.  For women ages 21-65, health care providers Haji recommend pelvic exams and Pap testing every 3 years, or they Udell recommend the Pap and pelvic exam, combined with testing for human papilloma virus (HPV), every 5 years. Some types of HPV increase your risk of cervical cancer. Testing for HPV Larabee also be done on women of any age with unclear Pap test results.  Other health care providers Englander not recommend any screening for nonpregnant women who are considered low risk for pelvic cancer and who do not have symptoms. Ask your health care provider if a screening pelvic exam is right for you.  If you have had past treatment for cervical cancer or a condition that could lead to cancer, you need Pap tests and screening for cancer for at least 20 years after your treatment. If Pap tests have been discontinued, your risk factors (such as having a new sexual partner) need to be reassessed to determine if screening should resume. Some women have medical problems that increase the chance of getting cervical cancer. In these cases, your health care provider Oshields recommend more frequent screening and Pap tests.  Colorectal Cancer  This type of  cancer can be detected and often prevented.  Routine colorectal cancer screening usually begins at 52 years of age and continues through 52 years of age.  Your health care provider Rubalcava recommend screening at an earlier age if you have risk factors for colon cancer.  Your health care provider Lycan also recommend using home test kits to check for hidden blood in the stool.  A small camera at the end of a tube can be used to examine your colon directly (sigmoidoscopy or colonoscopy). This is done to check for the earliest forms of colorectal cancer.  Routine screening usually begins at age 62.  Direct examination of the colon should be repeated every 5-10 years through 52 years of age. However, you Karas need to be screened more often if early forms of precancerous polyps or small growths are found.  Skin Cancer  Check your skin from head to toe regularly.  Tell your health care provider about any new  moles or changes in moles, especially if there is a change in a mole's shape or color.  Also tell your health care provider if you have a mole that is larger than the size of a pencil eraser.  Always use sunscreen. Apply sunscreen liberally and repeatedly throughout the day.  Protect yourself by wearing long sleeves, pants, a wide-brimmed hat, and sunglasses whenever you are outside.  Heart disease, diabetes, and high blood pressure  High blood pressure causes heart disease and increases the risk of stroke. High blood pressure is more likely to develop in: ? People who have blood pressure in the high end of the normal range (130-139/85-89 mm Hg). ? People who are overweight or obese. ? People who are African American.  If you are 70-73 years of age, have your blood pressure checked every 3-5 years. If you are 83 years of age or older, have your blood pressure checked every year. You should have your blood pressure measured twice-once when you are at a hospital or clinic, and once when you are  not at a hospital or clinic. Record the average of the two measurements. To check your blood pressure when you are not at a hospital or clinic, you can use: ? An automated blood pressure machine at a pharmacy. ? A home blood pressure monitor.  If you are between 21 years and 47 years old, ask your health care provider if you should take aspirin to prevent strokes.  Have regular diabetes screenings. This involves taking a blood sample to check your fasting blood sugar level. ? If you are at a normal weight and have a low risk for diabetes, have this test once every three years after 52 years of age. ? If you are overweight and have a high risk for diabetes, consider being tested at a younger age or more often. Preventing infection Hepatitis B  If you have a higher risk for hepatitis B, you should be screened for this virus. You are considered at high risk for hepatitis B if: ? You were born in a country where hepatitis B is common. Ask your health care provider which countries are considered high risk. ? Your parents were born in a high-risk country, and you have not been immunized against hepatitis B (hepatitis B vaccine). ? You have HIV or AIDS. ? You use needles to inject street drugs. ? You live with someone who has hepatitis B. ? You have had sex with someone who has hepatitis B. ? You get hemodialysis treatment. ? You take certain medicines for conditions, including cancer, organ transplantation, and autoimmune conditions.  Hepatitis C  Blood testing is recommended for: ? Everyone born from [birth year] through 1965. ? Anyone with known risk factors for hepatitis C.  Sexually transmitted infections (STIs)  You should be screened for sexually transmitted infections (STIs) including gonorrhea and chlamydia if: ? You are sexually active and are younger than 52 years of age. ? You are older than 52 years of age and your health care provider tells you that you are at risk for this type of  infection. ? Your sexual activity has changed since you were last screened and you are at an increased risk for chlamydia or gonorrhea. Ask your health care provider if you are at risk.  If you do not have HIV, but are at risk, it Camberos be recommended that you take a prescription medicine daily to prevent HIV infection. This is called pre-exposure prophylaxis (PrEP). You are considered at risk  if: ? You are sexually active and do not regularly use condoms or know the HIV status of your partner(s). ? You take drugs by injection. ? You are sexually active with a partner who has HIV.  Talk with your health care provider about whether you are at high risk of being infected with HIV. If you choose to begin PrEP, you should first be tested for HIV. You should then be tested every 3 months for as long as you are taking PrEP. Pregnancy  If you are premenopausal and you may become pregnant, ask your health care provider about preconception counseling.  If you may become pregnant, take 400 to 800 micrograms (mcg) of folic acid every day.  If you want to prevent pregnancy, talk to your health care provider about birth control (contraception). Osteoporosis and menopause  Osteoporosis is a disease in which the bones lose minerals and strength with aging. This can result in serious bone fractures. Your risk for osteoporosis can be identified using a bone density scan.  If you are 78 years of age or older, or if you are at risk for osteoporosis and fractures, ask your health care provider if you should be screened.  Ask your health care provider whether you should take a calcium or vitamin D supplement to lower your risk for osteoporosis.  Menopause may have certain physical symptoms and risks.  Hormone replacement therapy may reduce some of these symptoms and risks. Talk to your health care provider about whether hormone replacement therapy is right for you. Follow these instructions at home:  Schedule  regular health, dental, and eye exams.  Stay current with your immunizations.  Do not use any tobacco products including cigarettes, chewing tobacco, or electronic cigarettes.  If you are pregnant, do not drink alcohol.  If you are breastfeeding, limit how much and how often you drink alcohol.  Limit alcohol intake to no more than 1 drink per day for nonpregnant women. One drink equals 12 ounces of beer, 5 ounces of wine, or 1 ounces of hard liquor.  Do not use street drugs.  Do not share needles.  Ask your health care provider for help if you need support or information about quitting drugs.  Tell your health care provider if you often feel depressed.  Tell your health care provider if you have ever been abused or do not feel safe at home. This information is not intended to replace advice given to you by your health care provider. Make sure you discuss any questions you have with your health care provider. Document Released: 06/26/2011 Document Revised: 05/18/2016 Document Reviewed: 09/14/2015 Elsevier Interactive Patient Education  [employer].

## 2018-01-17 LAB — MICROALBUMIN / CREATININE URINE RATIO
Creatinine,U: 304.8 mg/dL
MICROALB/CREAT RATIO: 0.5 mg/g (ref 0.0–30.0)
Microalb, Ur: 1.4 mg/dL (ref 0.0–1.9)

## 2018-01-18 ENCOUNTER — Encounter: Payer: Self-pay | Admitting: Internal Medicine

## 2018-01-18 ENCOUNTER — Telehealth: Payer: Self-pay | Admitting: Internal Medicine

## 2018-01-18 DIAGNOSIS — J40 Bronchitis, not specified as acute or chronic: Secondary | ICD-10-CM | POA: Insufficient documentation

## 2018-01-18 NOTE — Assessment & Plan Note (Signed)
Annual comprehensive preventive exam was done as well as an evaluation and management of chronic conditions .  During the course of the visit the patient was educated and counseled about appropriate screening and preventive services including :  diabetes screening, lipid analysis with projected  10 year  risk for CAD , nutrition counseling, breast, cervical and colorectal cancer screening, and recommended immunizations.  Printed recommendations for health maintenance screenings was given 

## 2018-01-18 NOTE — Assessment & Plan Note (Signed)
PAP smear was normal Jan 2018 and she has deferred exam today

## 2018-01-18 NOTE — Assessment & Plan Note (Signed)
She is wheezing on exam,  Mildly.  Prednisone taper.  Add abx and MDI if no improvement in 48 hours

## 2018-01-18 NOTE — Assessment & Plan Note (Signed)
I have addressed  BMI and recommended a low glycemic index diet  vs Weight Watchers  utilizing smaller more frequent meals to increase metabolism.  I have also recommended that patient start exercising with a goal of 30 minutes of aerobic exercise a minimum of 5 days per week. Screening for lipid disorders, thyroid and diabetes was done today.  Lab Results  Component Value Date   CHOL 189 01/16/2018   HDL 56.30 01/16/2018   LDLCALC 115 (H) 01/16/2018   LDLDIRECT 145.6 09/18/2013   TRIG 89.0 01/16/2018   CHOLHDL 3 01/16/2018   Lab Results  Component Value Date   TSH 0.62 01/16/2018   Lab Results  Component Value Date   HGBA1C 5.5 01/16/2018

## 2018-01-18 NOTE — Assessment & Plan Note (Signed)
Well controlled on current regimen. Renal function stable, no changes today.  Lab Results  Component Value Date   CREATININE 0.89 01/16/2018   Lab Results  Component Value Date   NA 138 01/16/2018   K 3.9 01/16/2018   CL 102 01/16/2018   CO2 20 01/16/2018

## 2018-01-18 NOTE — Telephone Encounter (Signed)
Pleased order cologuard test for patient

## 2018-01-21 ENCOUNTER — Encounter: Payer: Self-pay | Admitting: *Deleted

## 2018-01-21 NOTE — Telephone Encounter (Signed)
Order faxed.

## 2018-02-05 ENCOUNTER — Telehealth: Payer: 59 | Admitting: Family

## 2018-02-05 DIAGNOSIS — N390 Urinary tract infection, site not specified: Secondary | ICD-10-CM

## 2018-02-05 DIAGNOSIS — A499 Bacterial infection, unspecified: Secondary | ICD-10-CM

## 2018-02-05 MED ORDER — CIPROFLOXACIN HCL 500 MG PO TABS: 500.0000 mg | ORAL_TABLET | Freq: Two times a day (BID) | ORAL | 0 refills | Status: DC

## 2018-02-05 NOTE — Progress Notes (Signed)

## 2018-02-06 ENCOUNTER — Encounter: Payer: Self-pay | Admitting: Internal Medicine

## 2018-02-06 DIAGNOSIS — Z1211 Encounter for screening for malignant neoplasm of colon: Secondary | ICD-10-CM | POA: Diagnosis not present

## 2018-02-06 DIAGNOSIS — Z1212 Encounter for screening for malignant neoplasm of rectum: Secondary | ICD-10-CM | POA: Diagnosis not present

## 2018-02-14 LAB — COLOGUARD

## 2018-02-20 ENCOUNTER — Telehealth: Payer: Self-pay | Admitting: Internal Medicine

## 2018-02-20 DIAGNOSIS — R195 Other fecal abnormalities: Secondary | ICD-10-CM | POA: Insufficient documentation

## 2018-02-20 NOTE — Telephone Encounter (Signed)
.  The results of patient's cologuard is  Positive. This may indicate a polyp somewhere in the colon.  I  would like to refer her  to GI for further evaluation .    Is  She  willing to see one and does he have a preference?  (MyChart message sent )

## 2018-02-23 ENCOUNTER — Encounter: Payer: Self-pay | Admitting: Internal Medicine

## 2018-02-25 ENCOUNTER — Other Ambulatory Visit: Payer: Self-pay | Admitting: Internal Medicine

## 2018-02-25 DIAGNOSIS — R195 Other fecal abnormalities: Secondary | ICD-10-CM

## 2018-02-25 NOTE — Progress Notes (Signed)
Referral to GI In progress

## 2018-03-05 ENCOUNTER — Ambulatory Visit
Admission: RE | Admit: 2018-03-05 | Discharge: 2018-03-05 | Disposition: A | Discharge status: Home / Self Care | Payer: 59 | Source: Ambulatory Visit | Attending: Internal Medicine | Admitting: Internal Medicine

## 2018-03-05 DIAGNOSIS — Z1231 Encounter for screening mammogram for malignant neoplasm of breast: Secondary | ICD-10-CM | POA: Diagnosis not present

## 2018-03-05 DIAGNOSIS — Z1239 Encounter for other screening for malignant neoplasm of breast: Secondary | ICD-10-CM

## 2018-03-15 ENCOUNTER — Other Ambulatory Visit: Payer: Self-pay

## 2018-03-15 DIAGNOSIS — R195 Other fecal abnormalities: Secondary | ICD-10-CM

## 2018-04-01 ENCOUNTER — Encounter: Payer: Self-pay | Admitting: Student

## 2018-04-02 ENCOUNTER — Ambulatory Visit: Payer: 59 | Admitting: Certified Registered Nurse Anesthetist

## 2018-04-02 ENCOUNTER — Ambulatory Visit
Admission: RE | Admit: 2018-04-02 | Discharge: 2018-04-02 | Disposition: A | Discharge status: Home / Self Care | Payer: 59 | Source: Ambulatory Visit | Attending: Gastroenterology | Admitting: Gastroenterology

## 2018-04-02 ENCOUNTER — Encounter
Admission: RE | Disposition: A | Discharge status: Home / Self Care | Payer: Self-pay | Source: Ambulatory Visit | Attending: Gastroenterology

## 2018-04-02 ENCOUNTER — Encounter: Payer: Self-pay | Admitting: Anesthesiology

## 2018-04-02 DIAGNOSIS — K635 Polyp of colon: Secondary | ICD-10-CM | POA: Diagnosis not present

## 2018-04-02 DIAGNOSIS — D123 Benign neoplasm of transverse colon: Secondary | ICD-10-CM | POA: Insufficient documentation

## 2018-04-02 DIAGNOSIS — R195 Other fecal abnormalities: Secondary | ICD-10-CM | POA: Insufficient documentation

## 2018-04-02 DIAGNOSIS — E785 Hyperlipidemia, unspecified: Secondary | ICD-10-CM | POA: Diagnosis not present

## 2018-04-02 DIAGNOSIS — F419 Anxiety disorder, unspecified: Secondary | ICD-10-CM | POA: Insufficient documentation

## 2018-04-02 DIAGNOSIS — I10 Essential (primary) hypertension: Secondary | ICD-10-CM | POA: Insufficient documentation

## 2018-04-02 DIAGNOSIS — F329 Major depressive disorder, single episode, unspecified: Secondary | ICD-10-CM | POA: Diagnosis not present

## 2018-04-02 DIAGNOSIS — F411 Generalized anxiety disorder: Secondary | ICD-10-CM | POA: Diagnosis not present

## 2018-04-02 DIAGNOSIS — D126 Benign neoplasm of colon, unspecified: Secondary | ICD-10-CM

## 2018-04-02 DIAGNOSIS — Z79899 Other long term (current) drug therapy: Secondary | ICD-10-CM | POA: Diagnosis not present

## 2018-04-02 DIAGNOSIS — Z1211 Encounter for screening for malignant neoplasm of colon: Secondary | ICD-10-CM | POA: Diagnosis not present

## 2018-04-02 HISTORY — PX: COLONOSCOPY WITH PROPOFOL: SHX5780

## 2018-04-02 SURGERY — COLONOSCOPY WITH PROPOFOL
Anesthesia: General

## 2018-04-02 MED ORDER — PROPOFOL 10 MG/ML IV BOLUS
INTRAVENOUS | Status: DC | PRN
  Administered 2018-04-02 (×3): 40 mg via INTRAVENOUS
  Administered 2018-04-02: 80 mg via INTRAVENOUS

## 2018-04-02 MED ORDER — LIDOCAINE HCL (PF) 1 % IJ SOLN
2.0000 mL | Freq: Once | INTRAMUSCULAR | Status: AC
  Administered 2018-04-02: 0.3 mL via INTRADERMAL

## 2018-04-02 MED ORDER — LIDOCAINE HCL (PF) 2 % IJ SOLN
INTRAMUSCULAR | Status: AC
  Filled 2018-04-02: qty 10

## 2018-04-02 MED ORDER — KETAMINE HCL 10 MG/ML IJ SOLN
INTRAMUSCULAR | Status: DC | PRN
  Administered 2018-04-02: 20 mg via INTRAVENOUS

## 2018-04-02 MED ORDER — LIDOCAINE HCL (CARDIAC) 20 MG/ML IV SOLN
INTRAVENOUS | Status: DC | PRN
  Administered 2018-04-02: 50 mg via INTRAVENOUS

## 2018-04-02 MED ORDER — SODIUM CHLORIDE 0.9 % IV SOLN
INTRAVENOUS | Status: DC
  Administered 2018-04-02: 1000 mL via INTRAVENOUS

## 2018-04-02 MED ORDER — PROPOFOL 10 MG/ML IV BOLUS
INTRAVENOUS | Status: AC
  Filled 2018-04-02: qty 20

## 2018-04-02 MED ORDER — LIDOCAINE HCL (PF) 1 % IJ SOLN
INTRAMUSCULAR | Status: AC
  Administered 2018-04-02: 0.3 mL via INTRADERMAL
  Filled 2018-04-02: qty 2

## 2018-04-02 NOTE — Anesthesia Preprocedure Evaluation (Signed)
Anesthesia Evaluation  Patient identified by MRN, date of birth, ID band Patient awake    Reviewed: Allergy & Precautions, NPO status , Patient's Chart, lab work & pertinent test results, reviewed documented beta blocker date and time   Airway Mallampati: III  TM Distance: >3 FB     Dental  (+) Chipped   Pulmonary           Cardiovascular hypertension, Pt. on medications      Neuro/Psych PSYCHIATRIC DISORDERS Anxiety Depression    GI/Hepatic   Endo/Other    Renal/GU      Musculoskeletal   Abdominal   Peds  Hematology   Anesthesia Other Findings Obese.  Reproductive/Obstetrics                             Anesthesia Physical Anesthesia Plan  ASA: III  Anesthesia Plan: General   Post-op Pain Management:    Induction: Intravenous  PONV Risk Score and Plan:   Airway Management Planned:   Additional Equipment:   Intra-op Plan:   Post-operative Plan:   Informed Consent: I have reviewed the patients History and Physical, chart, labs and discussed the procedure including the risks, benefits and alternatives for the proposed anesthesia with the patient or authorized representative who has indicated his/her understanding and acceptance.     Plan Discussed with: CRNA  Anesthesia Plan Comments:         Anesthesia Quick Evaluation

## 2018-04-02 NOTE — Anesthesia Post-op Follow-up Note (Signed)
Anesthesia QCDR form completed.        

## 2018-04-02 NOTE — Op Note (Signed)
[facility] Gastroenterology Patient Name: Laura Gray Procedure Date: 04/02/2018 9:54 AM MRN: [id number] Account #: [id number] Date of Birth: 18-Feb-1966 Admit Type: Outpatient Age: 52 Room: [facility] ENDO ROOM 4 Gender: Female Note Status: Finalized Procedure:            Colonoscopy Indications:          Positive Cologuard test Providers:            Lucilla Lame MD, MD Referring MD:         Deborra Medina, MD (Referring MD) Medicines:            Propofol per Anesthesia Complications:        No immediate complications. Procedure:            Pre-Anesthesia Assessment:                       - Prior to the procedure, a History and Physical was                        performed, and patient medications and allergies were                        reviewed. The patient's tolerance of previous                        anesthesia was also reviewed. The risks and benefits of                        the procedure and the sedation options and risks were                        discussed with the patient. All questions were                        answered, and informed consent was obtained. Prior                        Anticoagulants: The patient has taken no previous                        anticoagulant or antiplatelet agents. ASA Grade                        Assessment: II - A patient with mild systemic disease.                        After reviewing the risks and benefits, the patient was                        deemed in satisfactory condition to undergo the                        procedure.                       After obtaining informed consent, the colonoscope was                        passed under direct vision. Throughout the procedure,  the patient's blood pressure, pulse, and oxygen                        saturations were monitored continuously. The                        Colonoscope was introduced through the anus and                        advanced to the  the cecum, identified by appendiceal                        orifice and ileocecal valve. The colonoscopy was                        performed without difficulty. The patient tolerated the                        procedure well. The quality of the bowel preparation                        was good. Findings:      The perianal and digital rectal examinations were normal.      A 4 mm polyp was found in the transverse colon. The polyp was sessile.       The polyp was removed with a cold snare. Resection and retrieval were       complete.      The exam was otherwise without abnormality. Impression:           - One 4 mm polyp in the transverse colon, removed with                        a cold snare. Resected and retrieved.                       - The examination was otherwise normal. Recommendation:       - Discharge patient to home.                       - Resume previous diet.                       - Continue present medications.                       - Await pathology results.                       - Repeat colonoscopy in 5 years if polyp adenoma and 10                        years if hyperplastic Procedure Code(s):    --- Professional ---                       [phone], Colonoscopy, flexible; with removal of tumor(s),                        polyp(s), or other lesion(s) by snare technique Diagnosis Code(s):    --- Professional ---  R19.5, Other fecal abnormalities                       D12.3, Benign neoplasm of transverse colon (hepatic                        flexure or splenic flexure) CPT copyright 2017 American Medical Association. All rights reserved. The codes documented in this report are preliminary and upon coder review may  be revised to meet current compliance requirements. Lucilla Lame MD, MD 04/02/2018 10:27:13 AM This report has been signed electronically. Number of Addenda: 0 Note Initiated On: 04/02/2018 9:54 AM Scope Withdrawal Time: 0 hours 7 minutes 12  seconds  Total Procedure Duration: 0 hours 10 minutes 25 seconds       [facility]

## 2018-04-02 NOTE — Anesthesia Postprocedure Evaluation (Signed)
Anesthesia Post Note  Patient: Laura Gray  Procedure(s) Performed: COLONOSCOPY WITH PROPOFOL (N/A )  Patient location during evaluation: Endoscopy Anesthesia Type: General Level of consciousness: awake and alert Pain management: pain level controlled Vital Signs Assessment: post-procedure vital signs reviewed and stable Respiratory status: spontaneous breathing, nonlabored ventilation, respiratory function stable and patient connected to nasal cannula oxygen Cardiovascular status: blood pressure returned to baseline and stable Postop Assessment: no apparent nausea or vomiting Anesthetic complications: no     Last Vitals:  Vitals:   04/02/18 1029  BP: (!) 93/47  Temp: (!) 36.1 C    Last Pain:  Vitals:   04/02/18 1059  TempSrc:   PainSc: 0-No pain                 Zeeva Courser S

## 2018-04-02 NOTE — H&P (Signed)
Lucilla Lame, MD [address] [place], [place] [address] Phone:[phone] Fax : [phone]  Primary Care Physician:  Crecencio Mc, MD Primary Gastroenterologist:  Dr. Allen Norris  Pre-Procedure History & Physical: HPI:  Laura Gray is a 52 y.o. female is here for an colonoscopy.   Past Medical History:  Diagnosis Date  . Depression   . Hypertension     Past Surgical History:  Procedure Laterality Date  . DILATION AND CURETTAGE OF UTERUS  1989   miscarriage  . exploratory  2000   x 2, ectopic pregnancy x 2, one rupture    Prior to Admission medications   Medication Sig Start Date End Date Taking? Authorizing Provider  albuterol (PROVENTIL HFA;VENTOLIN HFA) 108 (90 Base) MCG/ACT inhaler Inhale 2 puffs into the lungs every 6 (six) hours as needed for wheezing or shortness of breath. Patient not taking: Reported on 04/02/2018 01/16/18   Crecencio Mc, MD  cetirizine (ZYRTEC) 10 MG tablet Take 10 mg by mouth daily.    [provider]  ciprofloxacin (CIPRO) 500 MG tablet Take 1 tablet (500 mg total) by mouth 2 (two) times daily. 02/05/18   Kennyth Arnold, FNP  cyclobenzaprine (FLEXERIL) 5 MG tablet Take 1 tablet (5 mg total) by mouth 3 (three) times daily as needed for muscle spasms. 10/18/16   Crecencio Mc, MD  FLUoxetine (PROZAC) 20 MG capsule TAKE ONE CAPSULE BY MOUTH DAILY AND TAKE AN ADDITIONAL DOSE ON ALL WEEKEND DAYS 01/16/18   Crecencio Mc, MD  levofloxacin (LEVAQUIN) 500 MG tablet Take 1 tablet (500 mg total) by mouth daily. 01/16/18   Crecencio Mc, MD  lisinopril (PRINIVIL,ZESTRIL) 10 MG tablet Take 1 tablet (10 mg total) by mouth daily. 01/16/18   Crecencio Mc, MD  Melatonin 1 MG CAPS Take 1 capsule by mouth at bedtime as needed.    [provider]  Multiple Vitamin (MULTIVITAMIN) tablet Take 1 tablet by mouth daily.    [provider]  predniSONE (DELTASONE) 10 MG tablet 6 tablets on Day 1 , then reduce by 1 tablet  daily until gone 01/16/18   Crecencio Mc, MD    Allergies as of 03/15/2018 - Review Complete 02/05/2018  Allergen Reaction Noted  . Codeine  03/27/2012  . Ultram [tramadol hcl]  03/27/2012    Family History  Problem Relation Age of Onset  . Hypertension Mother   . Hyperlipidemia Father   . Cancer Paternal Aunt        BLADDER  . Cancer Paternal Uncle        PANCREATIC  . Cancer Maternal Grandmother        BREAST, NOW CANCER FREE  . Dementia Maternal Grandmother   . Breast cancer Maternal Grandmother        Late 79's  . Cancer Maternal Grandfather        LUNG  . Hypertension Maternal Grandfather        AORTIC ANEURYSM REPAIR  . Cancer Paternal Grandfather        LUNG     Social History   Socioeconomic History  . Marital status: Divorced    Spouse name: Not on file  . Number of children: Not on file  . Years of education: Not on file  . Highest education level: Not on file  Occupational History  . Not on file  Social Needs  . Financial resource strain: Not on file  . Food insecurity:    Worry: Not  on file    Inability: Not on file  . Transportation needs:    Medical: Not on file    Non-medical: Not on file  Tobacco Use  . Smoking status: Never Smoker  . Smokeless tobacco: Never Used  Substance and Sexual Activity  . Alcohol use: No  . Drug use: No  . Sexual activity: Yes    Birth control/protection: Condom  Lifestyle  . Physical activity:    Days per week: Not on file    Minutes per session: Not on file  . Stress: Not on file  Relationships  . Social connections:    Talks on phone: Not on file    Gets together: Not on file    Attends religious service: Not on file    Active member of club or organization: Not on file    Attends meetings of clubs or organizations: Not on file    Relationship status: Not on file  . Intimate partner violence:    Fear of current or ex partner: Not on file    Emotionally abused: Not on file    Physically abused: Not  on file    Forced sexual activity: Not on file  Other Topics Concern  . Not on file  Social History Narrative  . Not on file    Review of Systems: See HPI, otherwise negative ROS  Physical Exam: LMP 07/24/2015  General:   Alert,  pleasant and cooperative in NAD Head:  Normocephalic and atraumatic. Neck:  Supple; no masses or thyromegaly. Lungs:  Clear throughout to auscultation.    Heart:  Regular rate and rhythm. Abdomen:  Soft, nontender and nondistended. Normal bowel sounds, without guarding, and without rebound.   Neurologic:  Alert and  oriented x4;  grossly normal neurologically.  Impression/Plan: Laura Gray is here for an colonoscopy to be performed for positive cologard  Risks, benefits, limitations, and alternatives regarding  colonoscopy have been reviewed with the patient.  Questions have been answered.  All parties agreeable.   Lucilla Lame, MD  04/02/2018, 9:57 AM

## 2018-04-02 NOTE — Transfer of Care (Signed)
Immediate Anesthesia Transfer of Care Note  Patient: Laura Gray  Procedure(s) Performed: COLONOSCOPY WITH PROPOFOL (N/A )  Patient Location: PACU and Endoscopy Unit  Anesthesia Type:General  Level of Consciousness: awake  Airway & Oxygen Therapy: Patient Spontanous Breathing  Post-op Assessment: Report given to RN  Post vital signs: stable  Last Vitals:  Vitals Value Taken Time  BP    Temp    Pulse 74 04/02/2018 10:29 AM  Resp 12 04/02/2018 10:30 AM  SpO2 100 % 04/02/2018 10:29 AM  Vitals shown include unvalidated device data.  Last Pain:  Vitals:   04/02/18 1029  PainSc: 0-No pain         Complications: No apparent anesthesia complications

## 2018-04-03 LAB — SURGICAL PATHOLOGY

## 2018-04-04 ENCOUNTER — Encounter: Payer: Self-pay | Admitting: Gastroenterology

## 2018-04-04 DIAGNOSIS — D126 Benign neoplasm of colon, unspecified: Secondary | ICD-10-CM

## 2018-04-16 IMAGING — CR DG ANKLE COMPLETE 3+V*R*
1 series · 3 of 3 positions shown · non-contrast
Comparison: 01/31/2013 right ankle radiographs.

CLINICAL DATA: Fall.  Right ankle pain.

EXAM:
RIGHT ANKLE - COMPLETE 3+ VIEW

[Series 1: dg ankle complete right · 0.14mm/px · 3 of 3 slices shown]
[im 1/3]
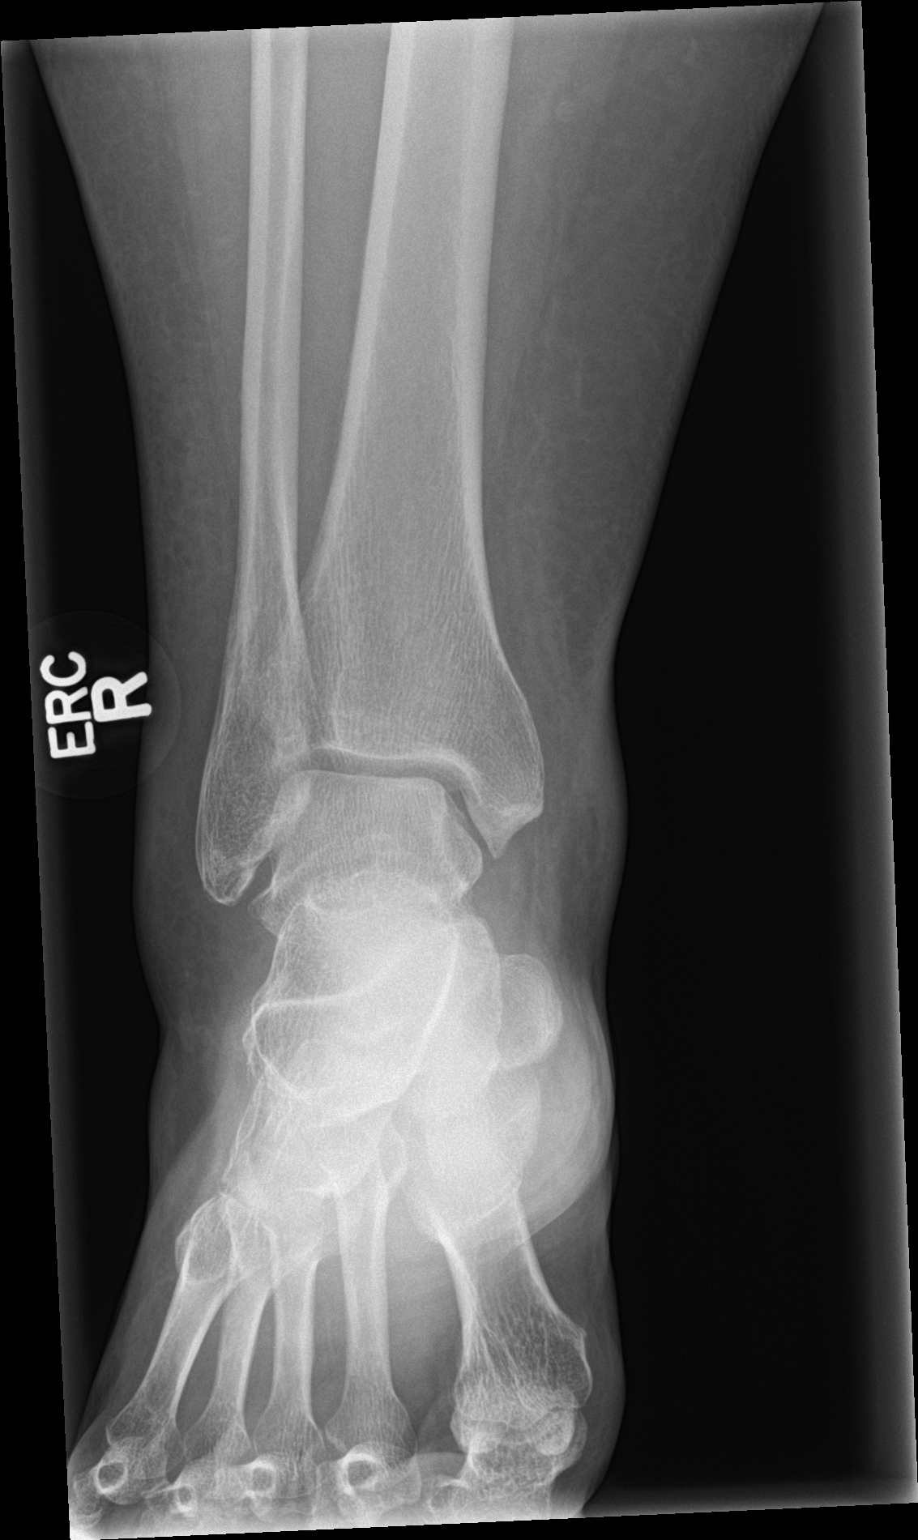
[im 2/3]
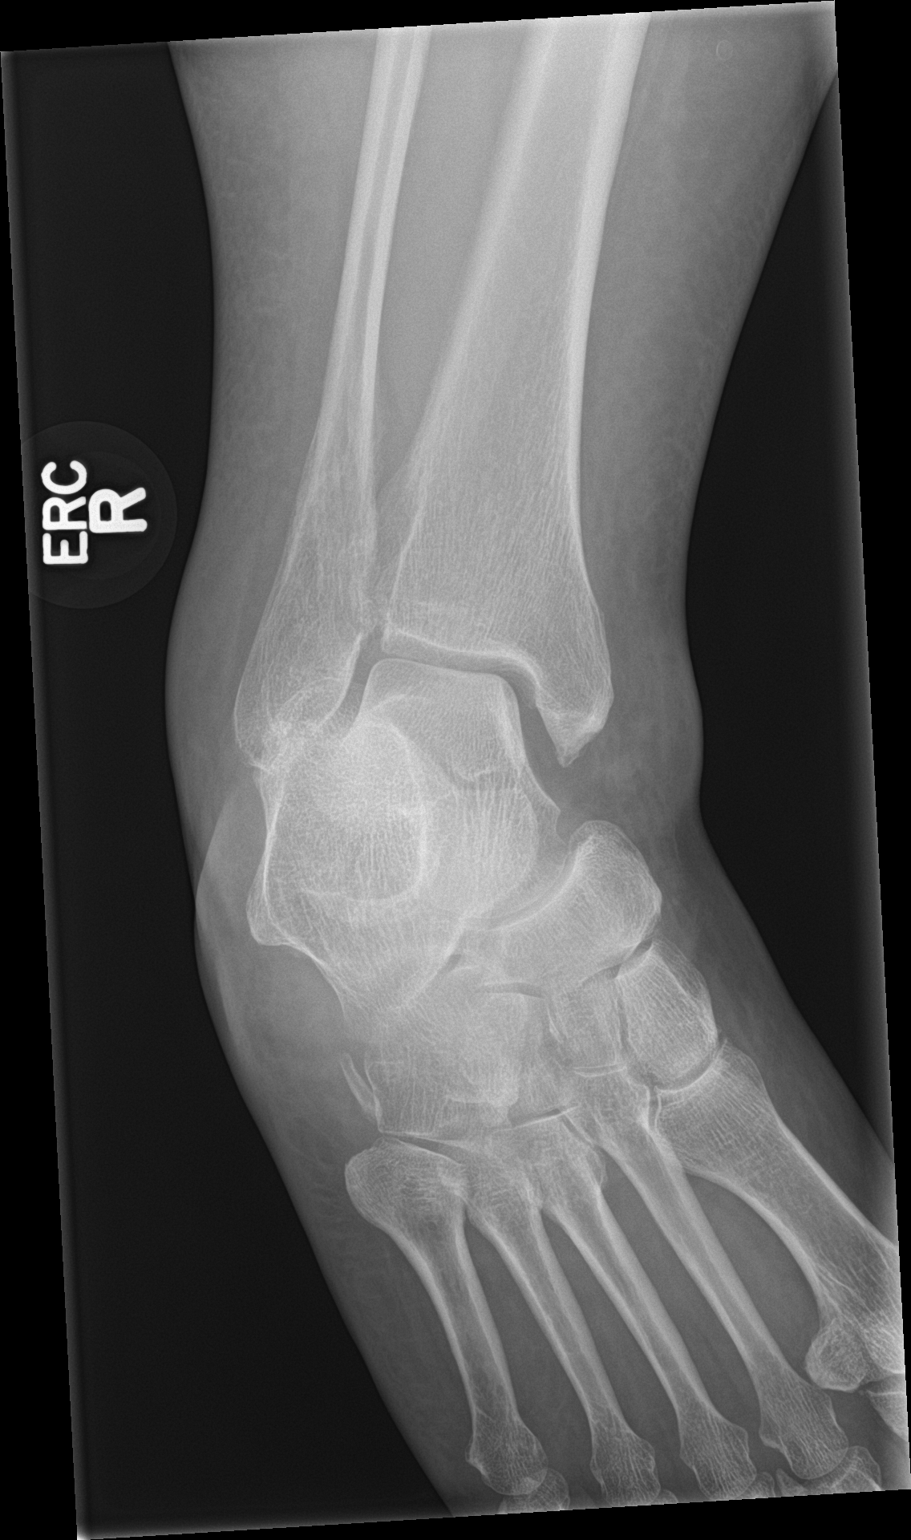
[im 3/3]
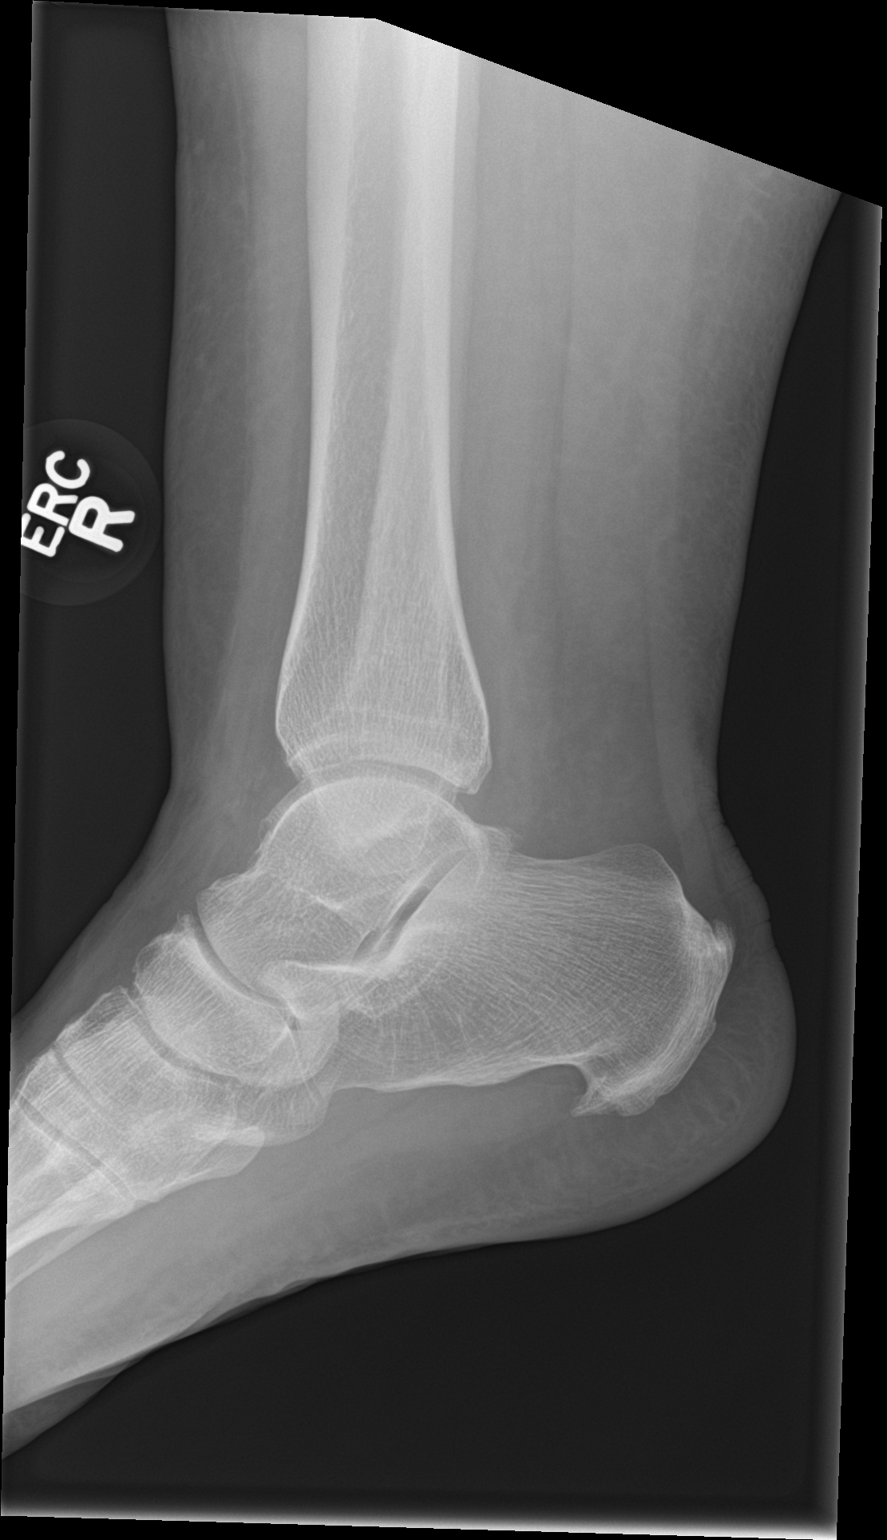

[3 of 3 positions shown; findings below may reference images not displayed]

FINDINGS: Diffuse right ankle soft tissue swelling. No fracture, subluxation
or suspicious focal osseous lesion. Small Achilles and plantar right
calcaneal spurs. No radiopaque foreign body.
IMPRESSION: Diffuse right ankle soft tissue swelling, with no fracture or
subluxation.

## 2018-07-17 ENCOUNTER — Ambulatory Visit: Payer: 59 | Admitting: Internal Medicine

## 2018-07-17 ENCOUNTER — Encounter: Payer: Self-pay | Admitting: Internal Medicine

## 2018-07-17 VITALS — BP 110/76 | HR 71 | Temp 98.2°F | Resp 15 | Ht 64.0 in | Wt 273.4 lb

## 2018-07-17 DIAGNOSIS — D126 Benign neoplasm of colon, unspecified: Secondary | ICD-10-CM | POA: Diagnosis not present

## 2018-07-17 DIAGNOSIS — E669 Obesity, unspecified: Secondary | ICD-10-CM | POA: Diagnosis not present

## 2018-07-17 DIAGNOSIS — I10 Essential (primary) hypertension: Secondary | ICD-10-CM

## 2018-07-17 DIAGNOSIS — F411 Generalized anxiety disorder: Secondary | ICD-10-CM

## 2018-07-17 LAB — COMPREHENSIVE METABOLIC PANEL
ALBUMIN: 3.8 g/dL (ref 3.5–5.2)
ALT: 25 U/L (ref 0–35)
AST: 17 U/L (ref 0–37)
Alkaline Phosphatase: 71 U/L (ref 39–117)
BUN: 10 mg/dL (ref 6–23)
CHLORIDE: 103 meq/L (ref 96–112)
CO2: 28 meq/L (ref 19–32)
CREATININE: 0.86 mg/dL (ref 0.40–1.20)
Calcium: 9.2 mg/dL (ref 8.4–10.5)
GFR: 73.6 mL/min (ref >60.00)
GLUCOSE: 106 mg/dL — AB (ref 70–99)
Potassium: 4 mEq/L (ref 3.5–5.1)
SODIUM: 137 meq/L (ref 135–145)
Total Bilirubin: 0.5 mg/dL (ref 0.2–1.2)
Total Protein: 7.2 g/dL (ref 6.0–8.3)

## 2018-07-17 MED ORDER — FLUOXETINE HCL 20 MG PO CAPS: ORAL_CAPSULE | ORAL | 1 refills | Status: DC

## 2018-07-17 MED ORDER — LISINOPRIL 10 MG PO TABS: 10.0000 mg | ORAL_TABLET | Freq: Every day | ORAL | 1 refills | Status: DC

## 2018-07-17 NOTE — Patient Instructions (Signed)
You are doing well!!  I'll see you in 6 months

## 2018-07-17 NOTE — Progress Notes (Signed)
Subjective:  Patient ID: Laura Gray, female    DOB: 1966/11/18  Age: 52 y.o. MRN: [id number]  CC: The primary encounter diagnosis was Essential hypertension. Diagnoses of Tubular adenoma of colon, Obesity (BMI 30-39.9), and Generalized anxiety disorder were also pertinent to this visit.  HPI Laura Gray  presents for follow up on hypertension ,  GAD, hypertension  and obesity. Losing weight .    Patient is taking her medications as prescribed and notes no adverse effects.  Home BP readings have been done about once per week and are  generally < 130/80 .  She is avoiding added salt in her diet and walking regularly about 3 times per week for exercise  .she is losng weight intentionally.    Outpatient Medications Prior to Visit  Medication Sig Dispense Refill  . cetirizine (ZYRTEC) 10 MG tablet Take 10 mg by mouth daily.    . Melatonin 1 MG CAPS Take 1 capsule by mouth at bedtime as needed.    . Multiple Vitamin (MULTIVITAMIN) tablet Take 1 tablet by mouth daily.    Marland Kitchen albuterol (PROVENTIL HFA;VENTOLIN HFA) 108 (90 Base) MCG/ACT inhaler Inhale 2 puffs into the lungs every 6 (six) hours as needed for wheezing or shortness of breath. 1 Inhaler 0  . cyclobenzaprine (FLEXERIL) 5 MG tablet Take 1 tablet (5 mg total) by mouth 3 (three) times daily as needed for muscle spasms. 90 tablet 1  . FLUoxetine (PROZAC) 20 MG capsule TAKE ONE CAPSULE BY MOUTH DAILY AND TAKE AN ADDITIONAL DOSE ON ALL WEEKEND DAYS 114 capsule 1  . lisinopril (PRINIVIL,ZESTRIL) 10 MG tablet Take 1 tablet (10 mg total) by mouth daily. 90 tablet 1  . ciprofloxacin (CIPRO) 500 MG tablet Take 1 tablet (500 mg total) by mouth 2 (two) times daily. (Patient not taking: Reported on 07/17/2018) 10 tablet 0  . levofloxacin (LEVAQUIN) 500 MG tablet Take 1 tablet (500 mg total) by mouth daily. (Patient not taking: Reported on 07/17/2018) 7 tablet 0  . predniSONE (DELTASONE) 10 MG tablet 6 tablets on Day 1 , then reduce by 1 tablet daily  until gone (Patient not taking: Reported on 07/17/2018) 21 tablet 0   No facility-administered medications prior to visit.     Review of Systems;  Patient denies headache, fevers, malaise, unintentional weight loss, skin rash, eye pain, sinus congestion and sinus pain, sore throat, dysphagia,  hemoptysis , cough, dyspnea, wheezing, chest pain, palpitations, orthopnea, edema, abdominal pain, nausea, melena, diarrhea, constipation, flank pain, dysuria, hematuria, urinary  Frequency, nocturia, numbness, tingling, seizures,  Focal weakness, Loss of consciousness,  Tremor, insomnia, depression, anxiety, and suicidal ideation.      Objective:  BP 110/76 (BP Location: Left Arm, Patient Position: Sitting, Cuff Size: Large)   Pulse 71   Temp 98.2 F (36.8 C) (Oral)   Resp 15   Ht 5\' 4"  (1.626 m)   Wt 273 lb 6.4 oz (124 kg)   LMP 07/24/2015   SpO2 97%   BMI 46.93 kg/m   BP Readings from Last 3 Encounters:  07/17/18 110/76  04/02/18 (!) 93/47  01/16/18 112/76    Wt Readings from Last 3 Encounters:  07/17/18 273 lb 6.4 oz (124 kg)  01/16/18 278 lb 12.8 oz (126.5 kg)  12/26/16 289 lb 8 oz (131.3 kg)    General appearance: alert, cooperative and appears stated age Ears: normal TM's and external ear canals both ears Throat: lips, mucosa, and tongue normal; teeth and gums normal Neck: no adenopathy,  no carotid bruit, supple, symmetrical, trachea midline and thyroid not enlarged, symmetric, no tenderness/mass/nodules Back: symmetric, no curvature. ROM normal. No CVA tenderness. Lungs: clear to auscultation bilaterally Heart: regular rate and rhythm, S1, S2 normal, no murmur, click, rub or gallop Abdomen: soft, non-tender; bowel sounds normal; no masses,  no organomegaly Pulses: 2+ and symmetric Skin: Skin color, texture, turgor normal. No rashes or lesions Lymph nodes: Cervical, supraclavicular, and axillary nodes normal.  Lab Results  Component Value Date   HGBA1C 5.5 01/16/2018    HGBA1C 5.5 10/18/2016   HGBA1C 5.2 09/18/2013    Lab Results  Component Value Date   CREATININE 0.86 07/17/2018   CREATININE 0.89 01/16/2018   CREATININE 0.80 10/18/2016    Lab Results  Component Value Date   WBC 7.3 06/17/2013   HGB 13.6 06/17/2013   HCT 39.1 06/17/2013   PLT 255.0 06/17/2013   GLUCOSE 106 (H) 07/17/2018   CHOL 189 01/16/2018   TRIG 89.0 01/16/2018   HDL 56.30 01/16/2018   LDLDIRECT 145.6 09/18/2013   LDLCALC 115 (H) 01/16/2018   ALT 25 07/17/2018   AST 17 07/17/2018   NA 137 07/17/2018   K 4.0 07/17/2018   CL 103 07/17/2018   CREATININE 0.86 07/17/2018   BUN 10 07/17/2018   CO2 28 07/17/2018   TSH 0.62 01/16/2018   HGBA1C 5.5 01/16/2018   MICROALBUR 1.4 01/16/2018    No results found.  Assessment & Plan:   Problem List Items Addressed This Visit    Tubular adenoma of colon    By diagnostic colonosopy after positive Cologuard test 5 yr follow up due in 2024      Obesity (BMI 30-39.9)    I have congratulated her in reduction of   BMI and encouraged  Continued weight loss with goal of 10% of body weigh over the next 6 months using a low glycemic index diet and regular exercise a minimum of 5 days per week.        Hypertension - Primary    Well controlled on current regimen. Renal function stable, no changes today.  Lab Results  Component Value Date   CREATININE 0.86 07/17/2018   Lab Results  Component Value Date   NA 137 07/17/2018   K 4.0 07/17/2018   CL 103 07/17/2018   CO2 28 07/17/2018         Relevant Medications   lisinopril (PRINIVIL,ZESTRIL) 10 MG tablet   Other Relevant Orders   Comprehensive metabolic panel (Completed)   Generalized anxiety disorder    Managed with Prozac.  Refills given       Relevant Medications   FLUoxetine (PROZAC) 20 MG capsule     A total of 15 minutes of face to face time was spent with patient more than half of which was spent in counselling about the above mentioned conditions  and  coordination of care  I have discontinued Myliyah L. Omahoney's cyclobenzaprine, predniSONE, levofloxacin, albuterol, and ciprofloxacin. I am also having her maintain her multivitamin, cetirizine, Melatonin, FLUoxetine, and lisinopril.  Meds ordered this encounter  Medications  . FLUoxetine (PROZAC) 20 MG capsule    Sig: TAKE ONE CAPSULE BY MOUTH DAILY AND TAKE AN ADDITIONAL DOSE ON ALL WEEKEND DAYS    Dispense:  114 capsule    Refill:  1  . lisinopril (PRINIVIL,ZESTRIL) 10 MG tablet    Sig: Take 1 tablet (10 mg total) by mouth daily.    Dispense:  90 tablet    Refill:  1  Medications Discontinued During This Encounter  Medication Reason  . ciprofloxacin (CIPRO) 500 MG tablet Completed Course  . levofloxacin (LEVAQUIN) 500 MG tablet Completed Course  . predniSONE (DELTASONE) 10 MG tablet Completed Course  . albuterol (PROVENTIL HFA;VENTOLIN HFA) 108 (90 Base) MCG/ACT inhaler   . FLUoxetine (PROZAC) 20 MG capsule Reorder  . lisinopril (PRINIVIL,ZESTRIL) 10 MG tablet Reorder  . cyclobenzaprine (FLEXERIL) 5 MG tablet     Follow-up: Return in about 6 months (around 01/17/2019).   Crecencio Mc, MD

## 2018-07-20 NOTE — Assessment & Plan Note (Signed)
I have congratulated her in reduction of   BMI and encouraged  Continued weight loss with goal of 10% of body weigh over the next 6 months using a low glycemic index diet and regular exercise a minimum of 5 days per week.    

## 2018-07-20 NOTE — Assessment & Plan Note (Signed)
Well controlled on current regimen. Renal function stable, no changes today.  Lab Results  Component Value Date   CREATININE 0.86 07/17/2018   Lab Results  Component Value Date   NA 137 07/17/2018   K 4.0 07/17/2018   CL 103 07/17/2018   CO2 28 07/17/2018

## 2018-07-20 NOTE — Assessment & Plan Note (Signed)
Managed with Prozac.  Refills given

## 2018-07-20 NOTE — Assessment & Plan Note (Signed)
By diagnostic colonosopy after positive Cologuard test;  5 yr follow up due in 2024 

## 2018-09-13 DIAGNOSIS — H5213 Myopia, bilateral: Secondary | ICD-10-CM | POA: Diagnosis not present

## 2018-11-26 ENCOUNTER — Other Ambulatory Visit: Payer: Self-pay | Admitting: Internal Medicine

## 2018-11-26 MED ORDER — FLUOXETINE HCL 40 MG PO CAPS: 40.0000 mg | ORAL_CAPSULE | Freq: Every day | ORAL | 0 refills | Status: DC

## 2019-01-22 ENCOUNTER — Encounter: Payer: Self-pay | Admitting: Internal Medicine

## 2019-01-22 ENCOUNTER — Ambulatory Visit: Payer: 59 | Admitting: Internal Medicine

## 2019-01-22 VITALS — BP 118/76 | HR 76 | Temp 98.0°F | Resp 15 | Ht 64.0 in | Wt 282.2 lb

## 2019-01-22 DIAGNOSIS — Z9189 Other specified personal risk factors, not elsewhere classified: Secondary | ICD-10-CM

## 2019-01-22 DIAGNOSIS — N912 Amenorrhea, unspecified: Secondary | ICD-10-CM

## 2019-01-22 DIAGNOSIS — Z1239 Encounter for other screening for malignant neoplasm of breast: Secondary | ICD-10-CM | POA: Diagnosis not present

## 2019-01-22 DIAGNOSIS — R7301 Impaired fasting glucose: Secondary | ICD-10-CM | POA: Diagnosis not present

## 2019-01-22 DIAGNOSIS — E785 Hyperlipidemia, unspecified: Secondary | ICD-10-CM

## 2019-01-22 DIAGNOSIS — E782 Mixed hyperlipidemia: Secondary | ICD-10-CM

## 2019-01-22 DIAGNOSIS — I10 Essential (primary) hypertension: Secondary | ICD-10-CM | POA: Diagnosis not present

## 2019-01-22 DIAGNOSIS — E669 Obesity, unspecified: Secondary | ICD-10-CM | POA: Diagnosis not present

## 2019-01-22 LAB — LIPID PANEL
CHOL/HDL RATIO: 4
Cholesterol: 209 mg/dL — ABNORMAL HIGH (ref 0–200)
HDL: 54.4 mg/dL (ref >39.00)
LDL CALC: 141 mg/dL — AB (ref 0–99)
NONHDL: 154.85
Triglycerides: 67 mg/dL (ref 0.0–149.0)
VLDL: 13.4 mg/dL (ref 0.0–40.0)

## 2019-01-22 LAB — COMPREHENSIVE METABOLIC PANEL
ALBUMIN: 3.8 g/dL (ref 3.5–5.2)
ALT: 23 U/L (ref 0–35)
AST: 16 U/L (ref 0–37)
Alkaline Phosphatase: 75 U/L (ref 39–117)
BUN: 12 mg/dL (ref 6–23)
CHLORIDE: 104 meq/L (ref 96–112)
CO2: 28 mEq/L (ref 19–32)
CREATININE: 0.81 mg/dL (ref 0.40–1.20)
Calcium: 9.4 mg/dL (ref 8.4–10.5)
GFR: 74.06 mL/min (ref >60.00)
GLUCOSE: 120 mg/dL — AB (ref 70–99)
Potassium: 4.5 mEq/L (ref 3.5–5.1)
SODIUM: 137 meq/L (ref 135–145)
Total Bilirubin: 0.4 mg/dL (ref 0.2–1.2)
Total Protein: 6.8 g/dL (ref 6.0–8.3)

## 2019-01-22 LAB — HEMOGLOBIN A1C: HEMOGLOBIN A1C: 5.3 % (ref 4.6–6.5)

## 2019-01-22 LAB — TSH: TSH: 0.63 u[IU]/mL (ref 0.35–4.50)

## 2019-01-22 MED ORDER — LISINOPRIL 10 MG PO TABS: 10.0000 mg | ORAL_TABLET | Freq: Every day | ORAL | 1 refills | Status: DC

## 2019-01-22 MED ORDER — FLUOXETINE HCL 40 MG PO CAPS: 40.0000 mg | ORAL_CAPSULE | Freq: Every day | ORAL | 1 refills | Status: DC

## 2019-01-22 NOTE — Progress Notes (Signed)
Subjective:  Patient ID: Laura Gray, female    DOB: May 02, 1966  Age: 53 y.o. MRN: [id number]  CC: The primary encounter diagnosis was Impaired fasting glucose. Diagnoses of Breast cancer screening, Amenorrhea, Hyperlipidemia LDL goal <100, Essential hypertension, Moderate mixed hyperlipidemia not requiring statin therapy, DES exposure in utero, and Obesity (BMI 30-39.9) were also pertinent to this visit.  HPI Laura Gray presents for FOLLOW UP on hypertension, obesity and impaired fasting glucose.  Patient is taking her medications as prescribed and notes no adverse effects.  Home BP readings have been done about once per week and are  generally < 130/80 .  She is avoiding added salt in her diet and not walking regularly   She has been stress eating due to new emotional stressors  Created by her tenage daughter's recent arrest for brining marijuana to school   .    Outpatient Medications Prior to Visit  Medication Sig Dispense Refill  . cetirizine (ZYRTEC) 10 MG tablet Take 10 mg by mouth daily.    . Melatonin 1 MG CAPS Take 1 capsule by mouth at bedtime as needed.    . Multiple Vitamin (MULTIVITAMIN) tablet Take 1 tablet by mouth daily.    Marland Kitchen FLUoxetine (PROZAC) 40 MG capsule Take 1 capsule (40 mg total) by mouth daily. TAKE ONE CAPSULE BY MOUTH DAILY AND TAKE AN ADDITIONAL DOSE ON ALL WEEKEND DAYS 90 capsule 0  . lisinopril (PRINIVIL,ZESTRIL) 10 MG tablet Take 1 tablet (10 mg total) by mouth daily. 90 tablet 1   No facility-administered medications prior to visit.     Review of Systems;  Patient denies headache, fevers, malaise, unintentional weight loss, skin rash, eye pain, sinus congestion and sinus pain, sore throat, dysphagia,  hemoptysis , cough, dyspnea, wheezing, chest pain, palpitations, orthopnea, edema, abdominal pain, nausea, melena, diarrhea, constipation, flank pain, dysuria, hematuria, urinary  Frequency, nocturia, numbness, tingling, seizures,  Focal weakness,  Loss of consciousness,  Tremor, insomnia, depression, anxiety, and suicidal ideation.      Objective:  BP 118/76 (BP Location: Left Arm, Patient Position: Sitting, Cuff Size: Large)   Pulse 76   Temp 98 F (36.7 C) (Oral)   Resp 15   Ht 5\' 4"  (1.626 m)   Wt 282 lb 3.2 oz (128 kg)   LMP 07/24/2015   SpO2 97%   BMI 48.44 kg/m   BP Readings from Last 3 Encounters:  01/22/19 118/76  07/17/18 110/76  04/02/18 (!) 93/47    Wt Readings from Last 3 Encounters:  01/22/19 282 lb 3.2 oz (128 kg)  07/17/18 273 lb 6.4 oz (124 kg)  01/16/18 278 lb 12.8 oz (126.5 kg)    General appearance: alert, cooperative and appears stated age Ears: normal TM's and external ear canals both ears Throat: lips, mucosa, and tongue normal; teeth and gums normal Neck: no adenopathy, no carotid bruit, supple, symmetrical, trachea midline and thyroid not enlarged, symmetric, no tenderness/mass/nodules Back: symmetric, no curvature. ROM normal. No CVA tenderness. Lungs: clear to auscultation bilaterally Heart: regular rate and rhythm, S1, S2 normal, no murmur, click, rub or gallop Abdomen: soft, non-tender; bowel sounds normal; no masses,  no organomegaly Pulses: 2+ and symmetric Skin: Skin color, texture, turgor normal. No rashes or lesions Lymph nodes: Cervical, supraclavicular, and axillary nodes normal.  Lab Results  Component Value Date   HGBA1C 5.3 01/22/2019   HGBA1C 5.5 01/16/2018   HGBA1C 5.5 10/18/2016    Lab Results  Component Value Date   CREATININE  0.81 01/22/2019   CREATININE 0.86 07/17/2018   CREATININE 0.89 01/16/2018    Lab Results  Component Value Date   WBC 7.3 06/17/2013   HGB 13.6 06/17/2013   HCT 39.1 06/17/2013   PLT 255.0 06/17/2013   GLUCOSE 120 (H) 01/22/2019   CHOL 209 (H) 01/22/2019   TRIG 67.0 01/22/2019   HDL 54.40 01/22/2019   LDLDIRECT 145.6 09/18/2013   LDLCALC 141 (H) 01/22/2019   ALT 23 01/22/2019   AST 16 01/22/2019   NA 137 01/22/2019   K 4.5  01/22/2019   CL 104 01/22/2019   CREATININE 0.81 01/22/2019   BUN 12 01/22/2019   CO2 28 01/22/2019   TSH 0.63 01/22/2019   HGBA1C 5.3 01/22/2019   MICROALBUR 1.4 01/16/2018    No results found.  Assessment & Plan:   Problem List Items Addressed This Visit    DES exposure in utero    She is overdue for PAP smear       Hypertension    Well controlled on current regimen. Renal function stable, no changes today. Lab Results  Component Value Date   CREATININE 0.81 01/22/2019   Lab Results  Component Value Date   NA 137 01/22/2019   K 4.5 01/22/2019   CL 104 01/22/2019   CO2 28 01/22/2019         Relevant Medications   lisinopril (PRINIVIL,ZESTRIL) 10 MG tablet   Impaired fasting glucose - Primary    Sh has n evidence of prediabetes by recurrent a1c  Lab Results  Component Value Date   HGBA1C 5.3 01/22/2019         Relevant Orders   Hemoglobin A1c (Completed)   Comprehensive metabolic panel (Completed)   Moderate mixed hyperlipidemia not requiring statin therapy    10 yr riskis 5% based on current profile.  Lab Results  Component Value Date   CHOL 209 (H) 01/22/2019   HDL 54.40 01/22/2019   LDLCALC 141 (H) 01/22/2019   LDLDIRECT 145.6 09/18/2013   TRIG 67.0 01/22/2019   CHOLHDL 4 01/22/2019         Relevant Medications   lisinopril (PRINIVIL,ZESTRIL) 10 MG tablet   Obesity (BMI 30-39.9)    I have  Addressed  BMI and encouraged  Continued weight loss with goal of 10% of body weigh over the next 6 months using a low glycemic index diet and regular exercise a minimum of 5 days per week.         Other Visit Diagnoses    Breast cancer screening       Relevant Orders   MM 3D SCREEN BREAST BILATERAL   Amenorrhea       Relevant Orders   TSH (Completed)   Hyperlipidemia LDL goal <100       Relevant Medications   lisinopril (PRINIVIL,ZESTRIL) 10 MG tablet   Other Relevant Orders   Lipid panel (Completed)      I have discontinued Laura L.  Gray's FLUoxetine. I have also changed her FLUoxetine. Additionally, I am having her maintain her multivitamin, cetirizine, Melatonin, and lisinopril.  Meds ordered this encounter  Medications  . DISCONTD: FLUoxetine (PROZAC) 40 MG capsule    Sig: Take 1 capsule (40 mg total) by mouth daily. TAKE ONE CAPSULE BY MOUTH DAILY AND TAKE AN ADDITIONAL DOSE ON ALL WEEKEND DAYS    Dispense:  90 capsule    Refill:  1    NOTE DOSE INCREASE.  PATIENT WILL NEED EARLY REFILL . PLEASE KEEP ON FILE FOR  NOW.  . lisinopril (PRINIVIL,ZESTRIL) 10 MG tablet    Sig: Take 1 tablet (10 mg total) by mouth daily.    Dispense:  90 tablet    Refill:  1  . FLUoxetine (PROZAC) 40 MG capsule    Sig: Take 1 capsule (40 mg total) by mouth daily.    Dispense:  90 capsule    Refill:  1    Medications Discontinued During This Encounter  Medication Reason  . FLUoxetine (PROZAC) 40 MG capsule Reorder  . lisinopril (PRINIVIL,ZESTRIL) 10 MG tablet Reorder  . FLUoxetine (PROZAC) 40 MG capsule     Follow-up: No follow-ups on file.   Crecencio Mc, MD

## 2019-01-22 NOTE — Patient Instructions (Signed)
Your annual mammogram has been ordered and is due on or after March 12 .  You are encouraged (required) to call to make your appointment at [facility]  [phone]   Preventing Type 2 Diabetes Mellitus Type 2 diabetes (type 2 diabetes mellitus) is a long-term (chronic) disease that affects blood sugar (glucose) levels. Normally, a hormone called insulin allows glucose to enter cells in the body. The cells use glucose for energy. In type 2 diabetes, one or both of these problems may be present:  The body does not make enough insulin.  The body does not respond properly to insulin that it makes (insulin resistance). Insulin resistance or lack of insulin causes excess glucose to build up in the blood instead of going into cells. As a result, high blood glucose (hyperglycemia) develops, which can cause many complications. Being overweight or obese and having an inactive (sedentary) lifestyle can increase your risk for diabetes. Type 2 diabetes can be delayed or prevented by making certain nutrition and lifestyle changes. What nutrition changes can be made?   Eat healthy meals and snacks regularly. Keep a healthy snack with you for when you get hungry between meals, such as fruit or a handful of nuts.  Eat lean meats and proteins that are low in saturated fats, such as chicken, fish, egg whites, and beans. Avoid processed meats.  Eat plenty of fruits and vegetables and plenty of grains that have not been processed (whole grains). It is recommended that you eat: ? 1?2 cups of fruit every day. ? 2?3 cups of vegetables every day. ? 6?8 oz of whole grains every day, such as oats, whole wheat, bulgur, brown rice, quinoa, and millet.  Eat low-fat dairy products, such as milk, yogurt, and cheese.  Eat foods that contain healthy fats, such as nuts, avocado, olive oil, and canola oil.  Drink water throughout the day. Avoid drinks that contain added sugar, such as soda or sweet tea.  Follow  instructions from your health care provider about specific eating or drinking restrictions.  Control how much food you eat at a time (portion size). ? Check food labels to find out the serving sizes of foods. ? Use a kitchen scale to weigh amounts of foods.  Saute or steam food instead of frying it. Cook with water or broth instead of oils or butter.  Limit your intake of: ? Salt (sodium). Have no more than 1 tsp (2,400 mg) of sodium a day. If you have heart disease or high blood pressure, have less than ? tsp (1,500 mg) of sodium a day. ? Saturated fat. This is fat that is solid at room temperature, such as butter or fat on meat. What lifestyle changes can be made? Activity   Do moderate-intensity physical activity for at least 30 minutes on at least 5 days of the week, or as much as told by your health care provider.  Ask your health care provider what activities are safe for you. A mix of physical activities may be best, such as walking, swimming, cycling, and strength training.  Try to add physical activity into your day. For example: ? Park in spots that are farther away than usual, so that you walk more. For example, park in a far corner of the parking lot when you go to the office or the grocery store. ? Take a walk during your lunch break. ? Use stairs instead of elevators or escalators. Weight Loss  Lose weight as directed. Your health care provider can  determine how much weight loss is best for you and can help you lose weight safely.  If you are overweight or obese, you may be instructed to lose at least 5?7 % of your body weight. Alcohol and Tobacco   Limit alcohol intake to no more than 1 drink a day for nonpregnant women and 2 drinks a day for men. One drink equals 12 oz of beer, 5 oz of wine, or 1 oz of hard liquor.  Do not use any tobacco products, such as cigarettes, chewing tobacco, and e-cigarettes. If you need help quitting, ask your health care provider. Work  With [place] Provider  Have your blood glucose tested regularly, as told by your health care provider.  Discuss your risk factors and how you can reduce your risk for diabetes.  Get screening tests as told by your health care provider. You may have screening tests regularly, especially if you have certain risk factors for type 2 diabetes.  Make an appointment with a diet and nutrition specialist (registered dietitian). A registered dietitian can help you make a healthy eating plan and can help you understand portion sizes and food labels. Why are these changes important?  It is possible to prevent or delay type 2 diabetes and related health problems by making lifestyle and nutrition changes.  It can be difficult to recognize signs of type 2 diabetes. The best way to avoid possible damage to your body is to take actions to prevent the disease before you develop symptoms. What can happen if changes are not made?  Your blood glucose levels may keep increasing. Having high blood glucose for a long time is dangerous. Too much glucose in your blood can damage your blood vessels, heart, kidneys, nerves, and eyes.  You may develop prediabetes or type 2 diabetes. Type 2 diabetes can lead to many chronic health problems and complications, such as: ? Heart disease. ? Stroke. ? Blindness. ? Kidney disease. ? Depression. ? Poor circulation in the feet and legs, which could lead to surgical removal (amputation) in severe cases. Where to find support  Ask your health care provider to recommend a registered dietitian, diabetes educator, or weight loss program.  Look for local or online weight loss groups.  Join a gym, fitness club, or outdoor activity group, such as a walking club. Where to find more information To learn more about diabetes and diabetes prevention, visit:  American Diabetes Association (ADA): www.diabetes.[employer] of Diabetes and Digestive and Kidney  Diseases: FindSpin.nl To learn more about healthy eating, visit:  The U.S. Department of Agriculture Scientist, research (physical sciences)), Choose My Plate: http://wiley-williams.com/  Office of Disease Prevention and Health Promotion (ODPHP), Dietary Guidelines: SurferLive.at Summary  You can reduce your risk for type 2 diabetes by increasing your physical activity, eating healthy foods, and losing weight as directed.  Talk with your health care provider about your risk for type 2 diabetes. Ask about any blood tests or screening tests that you need to have. This information is not intended to replace advice given to you by your health care provider. Make sure you discuss any questions you have with your health care provider. Document Released: 04/03/2016 Document Revised: 11/22/2017 Document Reviewed: 02/01/2016 Elsevier Interactive Patient Education  2019 [employer].

## 2019-01-23 DIAGNOSIS — E669 Obesity, unspecified: Secondary | ICD-10-CM | POA: Insufficient documentation

## 2019-01-23 DIAGNOSIS — I152 Hypertension secondary to endocrine disorders: Secondary | ICD-10-CM | POA: Insufficient documentation

## 2019-01-23 DIAGNOSIS — E782 Mixed hyperlipidemia: Secondary | ICD-10-CM | POA: Insufficient documentation

## 2019-01-23 DIAGNOSIS — R7301 Impaired fasting glucose: Secondary | ICD-10-CM | POA: Insufficient documentation

## 2019-01-23 NOTE — Assessment & Plan Note (Signed)
Well controlled on current regimen. Renal function stable, no changes today. Lab Results  Component Value Date   CREATININE 0.81 01/22/2019   Lab Results  Component Value Date   NA 137 01/22/2019   K 4.5 01/22/2019   CL 104 01/22/2019   CO2 28 01/22/2019

## 2019-01-23 NOTE — Assessment & Plan Note (Signed)
I have  Addressed  BMI and encouraged  Continued weight loss with goal of 10% of body weigh over the next 6 months using a low glycemic index diet and regular exercise a minimum of 5 days per week.

## 2019-01-23 NOTE — Assessment & Plan Note (Signed)
10 yr riskis 5% based on current profile.  Lab Results  Component Value Date   CHOL 209 (H) 01/22/2019   HDL 54.40 01/22/2019   LDLCALC 141 (H) 01/22/2019   LDLDIRECT 145.6 09/18/2013   TRIG 67.0 01/22/2019   CHOLHDL 4 01/22/2019

## 2019-01-23 NOTE — Assessment & Plan Note (Signed)
She is overdue for PAP smear

## 2019-01-23 NOTE — Assessment & Plan Note (Signed)
Sh has n evidence of prediabetes by recurrent a1c  Lab Results  Component Value Date   HGBA1C 5.3 01/22/2019

## 2019-01-28 ENCOUNTER — Encounter: Payer: Self-pay | Admitting: Internal Medicine

## 2019-03-24 MED FILL — FLUoxetine HCL 40 MG CAPS: 40 | 80 days supply | Qty: 90 | Fill #0

## 2019-03-24 MED FILL — LISINOPRIL 10 MG TABLET: 10 | 90 days supply | Qty: 90 | Fill #0

## 2019-08-04 DIAGNOSIS — I10 Essential (primary) hypertension: Secondary | ICD-10-CM

## 2019-08-04 MED ORDER — FLUOXETINE HCL 40 MG PO CAPS: 40.0000 mg | ORAL_CAPSULE | Freq: Every day | ORAL | 1 refills | Status: DC

## 2019-08-04 NOTE — Telephone Encounter (Signed)
Pt is requesting a refill for lisinopril.

## 2019-08-05 MED ORDER — TELMISARTAN 20 MG PO TABS: 20.0000 mg | ORAL_TABLET | Freq: Every day | ORAL | 0 refills | Status: DC

## 2019-08-05 MED ORDER — FLUOXETINE HCL 40 MG PO CAPS: 40.0000 mg | ORAL_CAPSULE | Freq: Every day | ORAL | 1 refills | Status: DC

## 2019-08-26 ENCOUNTER — Other Ambulatory Visit: Payer: Self-pay

## 2019-08-28 ENCOUNTER — Other Ambulatory Visit: Payer: Self-pay

## 2019-08-28 ENCOUNTER — Ambulatory Visit: Payer: 59 | Admitting: Internal Medicine

## 2019-08-28 ENCOUNTER — Encounter: Payer: Self-pay | Admitting: Internal Medicine

## 2019-08-28 VITALS — BP 122/78 | HR 74 | Temp 97.9°F | Resp 15 | Ht 64.0 in | Wt 277.0 lb

## 2019-08-28 DIAGNOSIS — R7301 Impaired fasting glucose: Secondary | ICD-10-CM

## 2019-08-28 DIAGNOSIS — I10 Essential (primary) hypertension: Secondary | ICD-10-CM

## 2019-08-28 DIAGNOSIS — Z9189 Other specified personal risk factors, not elsewhere classified: Secondary | ICD-10-CM | POA: Diagnosis not present

## 2019-08-28 DIAGNOSIS — E782 Mixed hyperlipidemia: Secondary | ICD-10-CM

## 2019-08-28 DIAGNOSIS — E669 Obesity, unspecified: Secondary | ICD-10-CM

## 2019-08-28 LAB — LIPID PANEL
Cholesterol: 223 mg/dL — ABNORMAL HIGH (ref 0–200)
HDL: 60.5 mg/dL (ref >39.00)
LDL Cholesterol: 143 mg/dL — ABNORMAL HIGH (ref 0–99)
NonHDL: 162.53
Total CHOL/HDL Ratio: 4
Triglycerides: 99 mg/dL (ref 0.0–149.0)
VLDL: 19.8 mg/dL (ref 0.0–40.0)

## 2019-08-28 LAB — HEMOGLOBIN A1C: Hgb A1c MFr Bld: 5.5 % (ref 4.6–6.5)

## 2019-08-28 LAB — COMPREHENSIVE METABOLIC PANEL
ALT: 19 U/L (ref 0–35)
AST: 15 U/L (ref 0–37)
Albumin: 3.8 g/dL (ref 3.5–5.2)
Alkaline Phosphatase: 72 U/L (ref 39–117)
BUN: 11 mg/dL (ref 6–23)
CO2: 30 mEq/L (ref 19–32)
Calcium: 9.2 mg/dL (ref 8.4–10.5)
Chloride: 100 mEq/L (ref 96–112)
Creatinine, Ser: 0.81 mg/dL (ref 0.40–1.20)
GFR: 73.89 mL/min (ref >60.00)
Glucose, Bld: 112 mg/dL — ABNORMAL HIGH (ref 70–99)
Potassium: 4.2 mEq/L (ref 3.5–5.1)
Sodium: 137 mEq/L (ref 135–145)
Total Bilirubin: 0.5 mg/dL (ref 0.2–1.2)
Total Protein: 6.7 g/dL (ref 6.0–8.3)

## 2019-08-28 MED ORDER — TELMISARTAN 20 MG PO TABS: 20.0000 mg | ORAL_TABLET | Freq: Every day | ORAL | 1 refills | Status: DC

## 2019-08-28 NOTE — Patient Instructions (Addendum)
The telmisartan is controlling your BP nicely.  Keep it simple.  Take it every night between 8 pm and midnight   You've lost 5 lbs without even trying.  Add exercise and it will probably be even more next time   GET YOUR MAMMOGRAM!   PAP smear next year

## 2019-08-28 NOTE — Progress Notes (Signed)
Subjective:  Patient ID: Laura Gray, female    DOB: [birth year]  Age: 53 y.o. MRN: [id number]  CC: The primary encounter diagnosis was Essential hypertension. Diagnoses of Impaired fasting glucose, Moderate mixed hyperlipidemia not requiring statin therapy, DES exposure in utero, and Obesity (BMI 30-39.9) were also pertinent to this visit.  HPI Laura Gray presents for  6 month follow up.   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  Home BP readings have been done about once per week and are  generally < 130/80 .  She is avoiding added salt in her diet and  Not walking or doing any exercise   .  Outpatient Medications Prior to Visit  Medication Sig Dispense Refill  . cetirizine (ZYRTEC) 10 MG tablet Take 10 mg by mouth daily.    Marland Kitchen FLUoxetine (PROZAC) 40 MG capsule Take 1 capsule (40 mg total) by mouth daily. 90 capsule 1  . Melatonin 1 MG CAPS Take 1 capsule by mouth at bedtime as needed.    Marland Kitchen telmisartan (MICARDIS) 20 MG tablet Take 1 tablet (20 mg total) by mouth at bedtime. 30 tablet 0  . Multiple Vitamin (MULTIVITAMIN) tablet Take 1 tablet by mouth daily.     No facility-administered medications prior to visit.     Review of Systems;  Patient denies headache, fevers, malaise, unintentional weight loss, skin rash, eye pain, sinus congestion and sinus pain, sore throat, dysphagia,  hemoptysis , cough, dyspnea, wheezing, chest pain, palpitations, orthopnea, edema, abdominal pain, nausea, melena, diarrhea, constipation, flank pain, dysuria, hematuria, urinary  Frequency, nocturia, numbness, tingling, seizures,  Focal weakness, Loss of consciousness,  Tremor, insomnia, depression, anxiety, and suicidal  ideation.      Objective:  BP 122/78 (BP Location: Left Arm, Patient Position: Sitting, Cuff Size: Large)   Pulse 74   Temp 97.9 F (36.6 C) (Temporal)   Resp 15   Ht 5\' 4"  (1.626 m)   Wt 277 lb (125.6 kg)   LMP 07/24/2015   SpO2 98%   BMI 47.55 kg/m   BP Readings from Last 3 Encounters:  08/28/19 122/78  01/22/19 118/76  07/17/18 110/76    Wt Readings from Last 3 Encounters:  08/28/19 277 lb (125.6 kg)  01/22/19 282 lb 3.2 oz (128 kg)  07/17/18 273 lb 6.4 oz (124 kg)    General appearance: alert, cooperative and appears stated age Ears: normal TM's and external ear canals both ears Throat: lips, mucosa, and tongue normal; teeth and gums normal Neck: no adenopathy, no carotid bruit, supple, symmetrical, trachea midline and thyroid not enlarged, symmetric, no tenderness/mass/nodules Back: symmetric, no curvature. ROM normal. No CVA tenderness. Lungs: clear to auscultation bilaterally Heart: regular rate and rhythm, S1, S2 normal, no murmur, click, rub or gallop Abdomen: soft, non-tender; bowel sounds normal; no masses,  no organomegaly Pulses: 2+ and symmetric Skin: Skin color, texture, turgor normal. No rashes or lesions Lymph nodes: Cervical, supraclavicular, and axillary nodes normal.  Lab Results  Component Value Date   HGBA1C 5.5 08/28/2019   HGBA1C 5.3 01/22/2019   HGBA1C 5.5 01/16/2018    Lab Results  Component Value Date   CREATININE 0.81 08/28/2019  CREATININE 0.81 01/22/2019   CREATININE 0.86 07/17/2018    Lab Results  Component Value Date   WBC 7.3 06/17/2013   HGB 13.6 06/17/2013   HCT 39.1 06/17/2013   PLT 255.0 06/17/2013   GLUCOSE 112 (H) 08/28/2019   CHOL 223 (H) 08/28/2019   TRIG 99.0 08/28/2019   HDL 60.50 08/28/2019   LDLDIRECT 145.6 09/18/2013   LDLCALC 143 (H) 08/28/2019   ALT 19 08/28/2019   AST 15 08/28/2019   NA 137 08/28/2019   K 4.2 08/28/2019   CL 100 08/28/2019   CREATININE 0.81 08/28/2019   BUN 11 08/28/2019   CO2  30 08/28/2019   TSH 0.63 01/22/2019   HGBA1C 5.5 08/28/2019   MICROALBUR 1.4 01/16/2018    No results found.  Assessment & Plan:   Problem List Items Addressed This Visit      Unprioritized   Hypertension - Primary    Well controlled on current regimen. Renal function stable, no changes today.  Lab Results  Component Value Date   CREATININE 0.81 08/28/2019   Lab Results  Component Value Date   NA 137 08/28/2019   K 4.2 08/28/2019   CL 100 08/28/2019   CO2 30 08/28/2019         Relevant Medications   telmisartan (MICARDIS) 20 MG tablet   Other Relevant Orders   Comprehensive metabolic panel (Completed)   Obesity (BMI 30-39.9)    I have addressed  BMI and encouraged  Continued weight loss with goal of 10% of body weight over the next 6 months using a low glycemic index diet and regular exercise a minimum of 5 days per week.        DES exposure in utero    She has not had a PAP smear since 2018 and will return for PAP  Smear and cytology of cervical and vaginal walls       Moderate mixed hyperlipidemia not requiring statin therapy    10 yr risk  Is 6.4% based on current profile.  Lab Results  Component Value Date   CHOL 223 (H) 08/28/2019   HDL 60.50 08/28/2019   LDLCALC 143 (H) 08/28/2019   LDLDIRECT 145.6 09/18/2013   TRIG 99.0 08/28/2019   CHOLHDL 4 08/28/2019         Relevant Medications   telmisartan (MICARDIS) 20 MG tablet   Other Relevant Orders   Lipid panel (Completed)   Impaired fasting glucose    Continue annual surveillance for diabetes.  Lab Results  Component Value Date   HGBA1C 5.5 08/28/2019         Relevant Orders   Hemoglobin A1c (Completed)      I have discontinued Akisha L. Splawn's multivitamin. I am also having her maintain her cetirizine, Melatonin, FLUoxetine, and telmisartan.  Meds ordered this encounter  Medications  . telmisartan (MICARDIS) 20 MG tablet    Sig: Take 1 tablet (20 mg total) by mouth at bedtime.     Dispense:  90 tablet    Refill:  1    Medications Discontinued During This Encounter  Medication Reason  . Multiple Vitamin (MULTIVITAMIN) tablet Patient has not taken in last 30 days  . telmisartan (MICARDIS) 20 MG tablet Reorder    Follow-up: Return in about 6 months (around 02/25/2020).   Crecencio Mc, MD

## 2019-08-31 NOTE — Assessment & Plan Note (Signed)
Well controlled on current regimen. Renal function stable, no changes today.  Lab Results  Component Value Date   CREATININE 0.81 08/28/2019   Lab Results  Component Value Date   NA 137 08/28/2019   K 4.2 08/28/2019   CL 100 08/28/2019   CO2 30 08/28/2019

## 2019-08-31 NOTE — Assessment & Plan Note (Signed)
I have addressed  BMI and encouraged  Continued weight loss with goal of 10% of body weight over the next 6 months using a low glycemic index diet and regular exercise a minimum of 5 days per week.

## 2019-08-31 NOTE — Assessment & Plan Note (Signed)
She has not had a PAP smear since 2018 and will return for PAP  Smear and cytology of cervical and vaginal walls

## 2019-08-31 NOTE — Assessment & Plan Note (Signed)
Continue annual surveillance for diabetes.  Lab Results  Component Value Date   HGBA1C 5.5 08/28/2019

## 2019-08-31 NOTE — Assessment & Plan Note (Signed)
10 yr risk  Is 6.4% based on current profile.  Lab Results  Component Value Date   CHOL 223 (H) 08/28/2019   HDL 60.50 08/28/2019   LDLCALC 143 (H) 08/28/2019   LDLDIRECT 145.6 09/18/2013   TRIG 99.0 08/28/2019   CHOLHDL 4 08/28/2019

## 2020-02-10 DIAGNOSIS — H5213 Myopia, bilateral: Secondary | ICD-10-CM | POA: Diagnosis not present

## 2020-02-24 ENCOUNTER — Other Ambulatory Visit: Payer: Self-pay

## 2020-02-26 ENCOUNTER — Ambulatory Visit: Payer: 59 | Admitting: Internal Medicine

## 2020-02-26 ENCOUNTER — Encounter: Payer: Self-pay | Admitting: Internal Medicine

## 2020-02-26 ENCOUNTER — Other Ambulatory Visit: Payer: Self-pay

## 2020-02-26 VITALS — BP 124/76 | HR 79 | Temp 97.2°F | Resp 15 | Ht 64.0 in | Wt 282.4 lb

## 2020-02-26 DIAGNOSIS — F411 Generalized anxiety disorder: Secondary | ICD-10-CM

## 2020-02-26 DIAGNOSIS — R7301 Impaired fasting glucose: Secondary | ICD-10-CM

## 2020-02-26 DIAGNOSIS — E782 Mixed hyperlipidemia: Secondary | ICD-10-CM

## 2020-02-26 DIAGNOSIS — Z1231 Encounter for screening mammogram for malignant neoplasm of breast: Secondary | ICD-10-CM | POA: Diagnosis not present

## 2020-02-26 DIAGNOSIS — I10 Essential (primary) hypertension: Secondary | ICD-10-CM

## 2020-02-26 DIAGNOSIS — Z9189 Other specified personal risk factors, not elsewhere classified: Secondary | ICD-10-CM

## 2020-02-26 LAB — COMPREHENSIVE METABOLIC PANEL
ALT: 19 U/L (ref 0–35)
AST: 15 U/L (ref 0–37)
Albumin: 3.7 g/dL (ref 3.5–5.2)
Alkaline Phosphatase: 73 U/L (ref 39–117)
BUN: 13 mg/dL (ref 6–23)
CO2: 29 mEq/L (ref 19–32)
Calcium: 9.2 mg/dL (ref 8.4–10.5)
Chloride: 101 mEq/L (ref 96–112)
Creatinine, Ser: 0.87 mg/dL (ref 0.40–1.20)
GFR: 67.91 mL/min (ref >60.00)
Glucose, Bld: 124 mg/dL — ABNORMAL HIGH (ref 70–99)
Potassium: 4 mEq/L (ref 3.5–5.1)
Sodium: 135 mEq/L (ref 135–145)
Total Bilirubin: 0.6 mg/dL (ref 0.2–1.2)
Total Protein: 6.8 g/dL (ref 6.0–8.3)

## 2020-02-26 LAB — HEMOGLOBIN A1C: Hgb A1c MFr Bld: 5.5 % (ref 4.6–6.5)

## 2020-02-26 LAB — LIPID PANEL
Cholesterol: 214 mg/dL — ABNORMAL HIGH (ref 0–200)
HDL: 56.2 mg/dL (ref >39.00)
LDL Cholesterol: 138 mg/dL — ABNORMAL HIGH (ref 0–99)
NonHDL: 157.87
Total CHOL/HDL Ratio: 4
Triglycerides: 101 mg/dL (ref 0.0–149.0)
VLDL: 20.2 mg/dL (ref 0.0–40.0)

## 2020-02-26 LAB — MICROALBUMIN / CREATININE URINE RATIO
Creatinine,U: 359.2 mg/dL
Microalb Creat Ratio: 0.4 mg/g (ref 0.0–30.0)
Microalb, Ur: 1.4 mg/dL (ref 0.0–1.9)

## 2020-02-26 MED ORDER — BUSPIRONE HCL 30 MG PO TABS: 30.0000 mg | ORAL_TABLET | Freq: Two times a day (BID) | ORAL | 2 refills | Status: DC

## 2020-02-26 MED ORDER — TELMISARTAN 20 MG PO TABS: 20.0000 mg | ORAL_TABLET | Freq: Every day | ORAL | 1 refills | Status: DC

## 2020-02-26 NOTE — Assessment & Plan Note (Signed)
She has not had a PAP smear since 2018 and will return for PAP  Smear and cytology of cervical and vaginal walls  In one month

## 2020-02-26 NOTE — Assessment & Plan Note (Addendum)
She has been deferring her mammograms.  Last one was in 2019 .  Will reorder at CPE next month

## 2020-02-26 NOTE — Assessment & Plan Note (Signed)
Well controlled on current regimen. Renal function due.,   no changes today. 

## 2020-02-26 NOTE — Assessment & Plan Note (Signed)
Managed with Prozac.  Work stressors have increased and she is feeling more anxious.  Adding buspirone 30 mg bid  As a trial

## 2020-02-26 NOTE — Progress Notes (Signed)
Subjective:  Patient ID: Laura Gray, female    DOB: 1966-04-16  Age: 54 y.o. MRN: [id number]  CC: The primary encounter diagnosis was Moderate mixed hyperlipidemia not requiring statin therapy. Diagnoses of Impaired fasting glucose, Essential hypertension, Encounter for screening mammogram for malignant neoplasm of breast, Generalized anxiety disorder, DES exposure in utero, and Morbid obesity ([place]) were also pertinent to this visit.  HPI Laura Gray presents for follow up on diabetes, hypertension GAD and obesity  Increased stressors due to staffing issues at work .. 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  Works weekends,  Increased number of drug overdoses occurring.  Not sleeping well,  Using melatoninin   10mg  at bedtime.  Denies use of stimulants,  Alcohol.  Discussed Adding buspirone and changing melatonin to dinner time dosing.   Hypertension: patient checks blood pressure twice weekly at home.  Readings have been for the most part14 < 130/80 at rest . Patient is following a reduce salt diet most days and is taking medications as prescribed  Obesisty:  Not exercising.   Has gained 7 lbs.  Plans to start walking with son.  Outpatient Medications Prior to Visit  Medication Sig Dispense Refill  . cetirizine (ZYRTEC) 10 MG tablet Take 10 mg by mouth daily.    Marland Kitchen FLUoxetine (PROZAC) 40 MG capsule Take 1 capsule (40 mg total) by mouth daily. 90 capsule 1  . Melatonin 1 MG CAPS Take 1 capsule by mouth at bedtime as needed.    Marland Kitchen telmisartan (MICARDIS) 20 MG tablet Take 1 tablet (20 mg total) by mouth at bedtime. 90 tablet 1   No facility-administered medications prior to visit.    Review of Systems;  Patient denies headache, fevers, malaise, unintentional weight loss, skin rash, eye pain, sinus congestion and sinus pain, sore throat, dysphagia,  hemoptysis , cough, dyspnea, wheezing, chest pain, palpitations, orthopnea, edema, abdominal  pain, nausea, melena, diarrhea, constipation, flank pain, dysuria, hematuria, urinary  Frequency, nocturia, numbness, tingling, seizures,  Focal weakness, Loss of consciousness,  Tremor, insomnia, depression, anxiety, and suicidal ideation.      Objective:  BP 124/76 (BP Location: Left Arm, Patient Position: Sitting, Cuff Size: Large)   Pulse 79   Temp (!) 97.2 F (36.2 C) (Temporal)   Resp 15   Ht 5\' 4"  (1.626 m)   Wt 282 lb 6.4 oz (128.1 kg)   LMP 07/24/2015   SpO2 97%   BMI 48.47 kg/m   BP Readings from Last 3 Encounters:  02/26/20 124/76  08/28/19 122/78  01/22/19 118/76    Wt Readings from Last 3 Encounters:  02/26/20 282 lb 6.4 oz (128.1 kg)  08/28/19 277 lb (125.6 kg)  01/22/19 282 lb 3.2 oz (128 kg)    General appearance: alert, cooperative and appears stated age Ears: normal TM's and external ear canals both ears Throat: lips, mucosa, and tongue normal; teeth and gums normal Neck: no adenopathy, no carotid bruit, supple, symmetrical, trachea midline and thyroid not enlarged, symmetric, no tenderness/mass/nodules Back: symmetric, no curvature. ROM normal. No CVA tenderness. Lungs: clear to auscultation bilaterally Heart: regular rate and rhythm, S1, S2 normal, no murmur, click, rub or gallop Abdomen: soft, non-tender; bowel sounds normal; no masses,  no organomegaly Pulses: 2+ and symmetric Skin: Skin color, texture, turgor normal. No rashes or lesions Lymph nodes: Cervical, supraclavicular, and axillary nodes normal.  Lab Results  Component Value Date   HGBA1C 5.5  08/28/2019   HGBA1C 5.3 01/22/2019   HGBA1C 5.5 01/16/2018    Lab Results  Component Value Date   CREATININE 0.81 08/28/2019   CREATININE 0.81 01/22/2019   CREATININE 0.86 07/17/2018    Lab Results  Component Value Date   WBC 7.3 06/17/2013   HGB 13.6 06/17/2013   HCT 39.1 06/17/2013   PLT 255.0 06/17/2013   GLUCOSE 112 (H) 08/28/2019   CHOL 223 (H) 08/28/2019   TRIG 99.0 08/28/2019    HDL 60.50 08/28/2019   LDLDIRECT 145.6 09/18/2013   LDLCALC 143 (H) 08/28/2019   ALT 19 08/28/2019   AST 15 08/28/2019   NA 137 08/28/2019   K 4.2 08/28/2019   CL 100 08/28/2019   CREATININE 0.81 08/28/2019   BUN 11 08/28/2019   CO2 30 08/28/2019   TSH 0.63 01/22/2019   HGBA1C 5.5 08/28/2019   MICROALBUR 1.4 01/16/2018    No results found.  Assessment & Plan:   Problem List Items Addressed This Visit      Unprioritized   Screening for breast cancer    She has been deferring her mammograms.  Last one was in 2019 .  Will reorder at CPE next month       Morbid obesity ([place])    I have encouarged her to work on  reduction of   BMI over the next 6 months using a low glycemic index diet  and regular exercise a minimum of 5 days per week.        Moderate mixed hyperlipidemia not requiring statin therapy - Primary   Relevant Medications   telmisartan (MICARDIS) 20 MG tablet   Other Relevant Orders   Lipid panel   Impaired fasting glucose   Relevant Orders   Hemoglobin A1c   Hypertension    Well controlled on current regimen. Renal function due, no changes today.      Relevant Medications   telmisartan (MICARDIS) 20 MG tablet   Other Relevant Orders   Comprehensive metabolic panel   Microalbumin / creatinine urine ratio   Generalized anxiety disorder    Managed with Prozac.  Work stressors have increased and she is feeling more anxious.  Adding buspirone 30 mg bid  As a trial       Relevant Medications   busPIRone (BUSPAR) 30 MG tablet   DES exposure in utero    She has not had a PAP smear since 2018 and will return for PAP  Smear and cytology of cervical and vaginal walls  In one month          I am having Malyssa L. Kennerly start on busPIRone. I am also having her maintain her cetirizine, Melatonin, FLUoxetine, and telmisartan.  Meds ordered this encounter  Medications  . telmisartan (MICARDIS) 20 MG tablet    Sig: Take 1 tablet (20 mg total) by mouth at  bedtime.    Dispense:  90 tablet    Refill:  1  . busPIRone (BUSPAR) 30 MG tablet    Sig: Take 1 tablet (30 mg total) by mouth 2 (two) times daily.    Dispense:  60 tablet    Refill:  2    Medications Discontinued During This Encounter  Medication Reason  . telmisartan (MICARDIS) 20 MG tablet Reorder   Follow-up: Return in about 4 weeks (around 03/25/2020).   Crecencio Mc, MD

## 2020-02-26 NOTE — Assessment & Plan Note (Signed)
I have encouarged her to work on  reduction of   BMI over the next 6 months using a low glycemic index diet  and regular exercise a minimum of 5 days per week.   

## 2020-02-26 NOTE — Patient Instructions (Addendum)
Adding buspirone 30 mg twice daily for anxiety  You can cut dose in half if too sedating  Continue prozac   schedule CPE with PAP in one month   Buspirone tablets What is this medicine? BUSPIRONE (byoo SPYE rone) is used to treat anxiety disorders. This medicine may be used for other purposes; ask your health care provider or pharmacist if you have questions. COMMON BRAND NAME(S): BuSpar What should I tell my health care provider before I take this medicine? They need to know if you have any of these conditions:  kidney or liver disease  an unusual or allergic reaction to buspirone, other medicines, foods, dyes, or preservatives  pregnant or trying to get pregnant  breast-feeding How should I use this medicine? Take this medicine by mouth with a glass of water. Follow the directions on the prescription label. You may take this medicine with or without food. To ensure that this medicine always works the same way for you, you should take it either always with or always without food. Take your doses at regular intervals. Do not take your medicine more often than directed. Do not stop taking except on the advice of your doctor or health care professional. Talk to your pediatrician regarding the use of this medicine in children. Special care may be needed. Overdosage: If you think you have taken too much of this medicine contact a poison control center or emergency room at once. NOTE: This medicine is only for you. Do not share this medicine with others. What if I miss a dose? If you miss a dose, take it as soon as you can. If it is almost time for your next dose, take only that dose. Do not take double or extra doses. What may interact with this medicine? Do not take this medicine with any of the following medications:  linezolid  MAOIs like Carbex, Eldepryl, Marplan, Nardil, and Parnate  methylene blue  procarbazine This medicine may also interact with the following  medications:  diazepam  digoxin  diltiazem  erythromycin  grapefruit juice  haloperidol  medicines for mental depression or mood problems  medicines for seizures like carbamazepine, phenobarbital and phenytoin  nefazodone  other medications for anxiety  rifampin  ritonavir  some antifungal medicines like itraconazole, ketoconazole, and voriconazole  verapamil  warfarin This list may not describe all possible interactions. Give your health care provider a list of all the medicines, herbs, non-prescription drugs, or dietary supplements you use. Also tell them if you smoke, drink alcohol, or use illegal drugs. Some items may interact with your medicine. What should I watch for while using this medicine? Visit your doctor or health care professional for regular checks on your progress. It may take 1 to 2 weeks before your anxiety gets better. You may get drowsy or dizzy. Do not drive, use machinery, or do anything that needs mental alertness until you know how this drug affects you. Do not stand or sit up quickly, especially if you are an older patient. This reduces the risk of dizzy or fainting spells. Alcohol can make you more drowsy and dizzy. Avoid alcoholic drinks. What side effects may I notice from receiving this medicine? Side effects that you should report to your doctor or health care professional as soon as possible:  blurred vision or other vision changes  chest pain  confusion  difficulty breathing  feelings of hostility or anger  muscle aches and pains  numbness or tingling in hands or feet  ringing in  the ears  skin rash and itching  vomiting  weakness Side effects that usually do not require medical attention (report to your doctor or health care professional if they continue or are bothersome):  disturbed dreams, nightmares  headache  nausea  restlessness or nervousness  sore throat and nasal congestion  stomach upset This list may  not describe all possible side effects. Call your doctor for medical advice about side effects. You may report side effects to FDA at 1-800-FDA-1088. Where should I keep my medicine? Keep out of the reach of children. Store at room temperature below 30 degrees C (86 degrees F). Protect from light. Keep container tightly closed. Throw away any unused medicine after the expiration date. NOTE: This sheet is a summary. It may not cover all possible information. If you have questions about this medicine, talk to your doctor, pharmacist, or health care provider.  2020 Elsevier/Gold Standard (2010-07-21 18:06:11)

## 2020-04-07 ENCOUNTER — Other Ambulatory Visit (HOSPITAL_COMMUNITY)
Admission: RE | Admit: 2020-04-07 | Discharge: 2020-04-07 | Disposition: A | Discharge status: Home / Self Care | Payer: 59 | Source: Ambulatory Visit | Attending: Internal Medicine | Admitting: Internal Medicine

## 2020-04-07 ENCOUNTER — Ambulatory Visit (INDEPENDENT_AMBULATORY_CARE_PROVIDER_SITE_OTHER): Payer: 59 | Admitting: Internal Medicine

## 2020-04-07 ENCOUNTER — Encounter: Payer: Self-pay | Admitting: Internal Medicine

## 2020-04-07 ENCOUNTER — Other Ambulatory Visit: Payer: Self-pay

## 2020-04-07 VITALS — BP 124/86 | HR 68 | Temp 97.5°F | Resp 15 | Ht 64.0 in | Wt 284.4 lb

## 2020-04-07 DIAGNOSIS — Z9189 Other specified personal risk factors, not elsewhere classified: Secondary | ICD-10-CM

## 2020-04-07 DIAGNOSIS — F411 Generalized anxiety disorder: Secondary | ICD-10-CM

## 2020-04-07 DIAGNOSIS — Z0000 Encounter for general adult medical examination without abnormal findings: Secondary | ICD-10-CM | POA: Diagnosis not present

## 2020-04-07 DIAGNOSIS — Z124 Encounter for screening for malignant neoplasm of cervix: Secondary | ICD-10-CM | POA: Diagnosis not present

## 2020-04-07 DIAGNOSIS — R7301 Impaired fasting glucose: Secondary | ICD-10-CM

## 2020-04-07 DIAGNOSIS — Z1231 Encounter for screening mammogram for malignant neoplasm of breast: Secondary | ICD-10-CM | POA: Diagnosis not present

## 2020-04-07 MED ORDER — FLUOXETINE HCL 40 MG PO CAPS: 40.0000 mg | ORAL_CAPSULE | Freq: Every day | ORAL | 1 refills | Status: DC

## 2020-04-07 MED ORDER — BUSPIRONE HCL 30 MG PO TABS: 30.0000 mg | ORAL_TABLET | Freq: Two times a day (BID) | ORAL | 3 refills | Status: DC

## 2020-04-07 NOTE — Assessment & Plan Note (Signed)
Encouraged to resume annual PAP smears.  Done today

## 2020-04-07 NOTE — Assessment & Plan Note (Signed)

## 2020-04-07 NOTE — Assessment & Plan Note (Signed)
No significant change with buspirone 30 mg bid added to prozac 40 mg .  Advised to increase prozac to 60 mg daily

## 2020-04-07 NOTE — Progress Notes (Signed)
Patient ID: Laura Gray, female    DOB: [birth year]  Age: 54 y.o. MRN: [id number]  The patient is here for annual preventive examination and management of other chronic and acute problems.  This visit occurred during the SARS-CoV-2 public health emergency.  Safety protocols were in place, including screening questions prior to the visit, additional usage of staff PPE, and extensive cleaning of exam room while observing appropriate contact time as indicated for disinfecting solutions.    Patient has deferred all  Of the  available COVID 19 vaccine despite working as an Health visitor at [employer] .  Patient continues to mask when outside of the home except when walking in yard or at safe distances from others .  Patient has experienced incresaed anxiety  But denies any development of unhealthy behaviors resulting from the pandemic's restriction of activities and socialization.     The risk factors are reflected in the social history.  The roster of all physicians providing medical care to patient - is listed in the Snapshot section of the chart.  Activities of daily living:  The patient is 100% independent in all ADLs: dressing, toileting, feeding as well as independent mobility  Home safety : The patient has smoke detectors in the home. They wear seatbelts.  There are no firearms at home. There is no violence in the home.   There is no risks for hepatitis, STDs or HIV. There is no   history of blood transfusion. They have no travel history to infectious disease endemic areas of the world.  The patient has seen their dentist in the last six month. They have seen their eye doctor in the last year. They admit to slight hearing difficulty with regard to whispered voices and some television programs.  They have deferred audiologic testing in the last year.  They do not  have excessive sun exposure. Discussed the need for sun protection: hats, long sleeves and use of sunscreen if there is significant sun exposure.    Diet: the importance of a healthy diet is discussed. They do have a healthy diet.  The benefits of regular aerobic exercise were discussed. She walks 4 times per week ,  20 minutes.   Depression screen: there are no signs or vegative symptoms of depression- irritability, change in appetite, anhedonia, sadness/tearfullness.  Cognitive assessment: the patient manages all their financial and personal affairs and is actively engaged. They could relate day,date,year and events; recalled 2/3 objects at 3 minutes; performed clock-face test normally.  The following portions of the patient's history were reviewed and updated as appropriate: allergies, current medications, past family history, past medical history,  past surgical history, past social history  and problem list.  Visual acuity was not assessed per patient preference since she has regular follow up with her ophthalmologist. Hearing and body mass index were assessed and reviewed.   During the course of the visit the patient was educated and counseled about appropriate screening and preventive services including : fall prevention , diabetes screening, nutrition counseling, colorectal cancer screening, and recommended immunizations.    CC: The primary encounter diagnosis was Cervical cancer screening. Diagnoses of Breast cancer screening by mammogram, Encounter for preventive health examination, DES exposure in utero, Generalized anxiety disorder, and Impaired fasting glucose were also pertinent to this visit.  Increased anxiety.  No change with addition of buspirone 30 mg bid.  Still taking 40 mg proac.  Irritability,  Denies panic attacks.   History Hartley has a past medical history of  Depression and Hypertension.   She has a past surgical history that includes Dilation and curettage of uterus (1989); exploratory (2000); and Colonoscopy with propofol (N/A, 04/02/2018).   Her family history includes Breast cancer in her maternal grandmother;  Cancer in her maternal grandfather, maternal grandmother, paternal aunt, paternal grandfather, and paternal uncle; Dementia in her maternal grandmother; Hyperlipidemia in her father; Hypertension in her maternal grandfather and mother.She reports that she has never smoked. She has never used smokeless tobacco. She reports that she does not drink alcohol or use drugs.  Outpatient Medications Prior to Visit  Medication Sig Dispense Refill  . cetirizine (ZYRTEC) 10 MG tablet Take 10 mg by mouth daily.    . Melatonin 1 MG CAPS Take 1 capsule by mouth at bedtime as needed.    Marland Kitchen telmisartan (MICARDIS) 20 MG tablet Take 1 tablet (20 mg total) by mouth at bedtime. 90 tablet 1  . busPIRone (BUSPAR) 30 MG tablet Take 1 tablet (30 mg total) by mouth 2 (two) times daily. 60 tablet 2  . FLUoxetine (PROZAC) 40 MG capsule Take 1 capsule (40 mg total) by mouth daily. 90 capsule 1   No facility-administered medications prior to visit.    Review of Systems   Patient denies headache, fevers, malaise, unintentional weight loss, skin rash, eye pain, sinus congestion and sinus pain, sore throat, dysphagia,  hemoptysis , cough, dyspnea, wheezing, chest pain, palpitations, orthopnea, edema, abdominal pain, nausea, melena, diarrhea, constipation, flank pain, dysuria, hematuria, urinary  Frequency, nocturia, numbness, tingling, seizures,  Focal weakness, Loss of consciousness,  Tremor, insomnia, depression, and suicidal ideation.      Objective:  BP 124/86 (BP Location: Left Arm, Patient Position: Sitting, Cuff Size: Large)   Pulse 68   Temp (!) 97.5 F (36.4 C) (Temporal)   Resp 15   Ht 5\' 4"  (1.626 m)   Wt 284 lb 6.4 oz (129 kg)   LMP 07/24/2015   SpO2 98%   BMI 48.82 kg/m   Physical Exam   General Appearance:    Alert, cooperative, no distress, appears stated age  Head:    Normocephalic, without obvious abnormality, atraumatic  Eyes:    PERRL, conjunctiva/corneas clear, EOM's intact, fundi    benign,  both eyes  Ears:    Normal TM's and external ear canals, both ears  Nose:   Nares normal, septum midline, mucosa normal, no drainage    or sinus tenderness  Throat:   Lips, mucosa, and tongue normal; teeth and gums normal  Neck:   Supple, symmetrical, trachea midline, no adenopathy;    thyroid:  no enlargement/tenderness/nodules; no carotid   bruit or JVD  Back:     Symmetric, no curvature, ROM normal, no CVA tenderness  Lungs:     Clear to auscultation bilaterally, respirations unlabored  Chest Wall:    No tenderness or deformity   Heart:    Regular rate and rhythm, S1 and S2 normal, no murmur, rub   or gallop  Breast Exam:    No tenderness, masses, or nipple abnormality  Abdomen:     Soft, non-tender, bowel sounds active all four quadrants,    no masses, no organomegaly  Genitalia:    Pelvic: cervix normal in appearance, external genitalia normal, no adnexal masses or tenderness, no cervical motion tenderness, rectovaginal septum normal, uterus normal size, shape, and consistency and vagina normal without discharge  Extremities:   Extremities normal, atraumatic, no cyanosis or edema  Pulses:   2+ and symmetric all extremities  Skin:   Skin color, texture, turgor normal, no rashes or lesions  Lymph nodes:   Cervical, supraclavicular, and axillary nodes normal  Neurologic:   CNII-XII intact, normal strength, sensation and reflexes    throughout       Assessment & Plan:   Problem List Items Addressed This Visit      Unprioritized   DES exposure in utero    Encouraged to resume annual PAP smears.  Done today       Encounter for preventive health examination    age appropriate education and counseling updated, referrals for preventative services and immunizations addressed, dietary and smoking counseling addressed, most recent labs reviewed.  I have personally reviewed and have noted:  1) the patient's medical and social history 2) The pt's use of alcohol, tobacco, and illicit  drugs 3) The patient's current medications and supplements 4) Functional ability including ADL's, fall risk, home safety risk, hearing and visual impairment 5) Diet and physical activities 6) Evidence for depression or mood disorder 7) The patient's height, weight, and BMI have been recorded in the chart  I have made referrals, and provided counseling and education based on review of the above      Generalized anxiety disorder    No significant change with buspirone 30 mg bid added to prozac 40 mg .  Advised to increase prozac to 60 mg daily       Relevant Medications   FLUoxetine (PROZAC) 40 MG capsule   busPIRone (BUSPAR) 30 MG tablet   Impaired fasting glucose    a1c was normal.  Low GI diet advised,  Weight loss through diet and exercise advised        Other Visit Diagnoses    Cervical cancer screening    -  Primary   Relevant Orders   Cytology - PAP( [place])   Breast cancer screening by mammogram       Relevant Orders   MM 3D SCREEN BREAST BILATERAL      I am having Delina L. Coley maintain her cetirizine, Melatonin, telmisartan, FLUoxetine, and busPIRone.  Meds ordered this encounter  Medications  . FLUoxetine (PROZAC) 40 MG capsule    Sig: Take 1 capsule (40 mg total) by mouth daily.    Dispense:  90 capsule    Refill:  1  . busPIRone (BUSPAR) 30 MG tablet    Sig: Take 1 tablet (30 mg total) by mouth 2 (two) times daily.    Dispense:  180 tablet    Refill:  3    Medications Discontinued During This Encounter  Medication Reason  . FLUoxetine (PROZAC) 40 MG capsule Reorder  . busPIRone (BUSPAR) 30 MG tablet     Follow-up: No follow-ups on file.   Crecencio Mc, MD

## 2020-04-07 NOTE — Assessment & Plan Note (Signed)
a1c was normal.  Low GI diet advised,  Weight loss through diet and exercise advised  

## 2020-04-07 NOTE — Patient Instructions (Addendum)
Increase prozac to 60 mg daily   Continue buspirone twice daily  Let me know how you feel in 2 weeks  Your annual mammogram has been ordered.  We are no longer allowed to schedule it for you so You are encouraged (required) to call to make your appointment at [facility]   Health Maintenance for Postmenopausal Women Menopause is a normal process in which your ability to get pregnant comes to an end. This process happens slowly over many months or years, usually between the ages of 21 and 14. Menopause is complete when you have missed your menstrual periods for 12 months. It is important to talk with your health care provider about some of the most common conditions that affect women after menopause (postmenopausal women). These include heart disease, cancer, and bone loss (osteoporosis). Adopting a healthy lifestyle and getting preventive care can help to promote your health and wellness. The actions you take can also lower your chances of developing some of these common conditions. What should I know about menopause? During menopause, you may get a number of symptoms, such as:  Hot flashes. These can be moderate or severe.  Night sweats.  Decrease in sex drive.  Mood swings.  Headaches.  Tiredness.  Irritability.  Memory problems.  Insomnia. Choosing to treat or not to treat these symptoms is a decision that you make with your health care provider. Do I need hormone replacement therapy?  Hormone replacement therapy is effective in treating symptoms that are caused by menopause, such as hot flashes and night sweats.  Hormone replacement carries certain risks, especially as you become older. If you are thinking about using estrogen or estrogen with progestin, discuss the benefits and risks with your health care provider. What is my risk for heart disease and stroke? The risk of heart disease, heart attack, and stroke increases as you age. One of the causes may be a  change in the body's hormones during menopause. This can affect how your body uses dietary fats, triglycerides, and cholesterol. Heart attack and stroke are medical emergencies. There are many things that you can do to help prevent heart disease and stroke. Watch your blood pressure  High blood pressure causes heart disease and increases the risk of stroke. This is more likely to develop in people who have high blood pressure readings, are of African descent, or are overweight.  Have your blood pressure checked: ? Every 3-5 years if you are 58-17 years of age. ? Every year if you are 70 years old or older. Eat a healthy diet   Eat a diet that includes plenty of vegetables, fruits, low-fat dairy products, and lean protein.  Do not eat a lot of foods that are high in solid fats, added sugars, or sodium. Get regular exercise Get regular exercise. This is one of the most important things you can do for your health. Most adults should:  Try to exercise for at least 150 minutes each week. The exercise should increase your heart rate and make you sweat (moderate-intensity exercise).  Try to do strengthening exercises at least twice each week. Do these in addition to the moderate-intensity exercise.  Spend less time sitting. Even light physical activity can be beneficial. Other tips  Work with your health care provider to achieve or maintain a healthy weight.  Do not use any products that contain nicotine or tobacco, such as cigarettes, e-cigarettes, and chewing tobacco. If you need help quitting, ask your health care provider.  Know your numbers. Ask your health care provider to check your cholesterol and your blood sugar (glucose). Continue to have your blood tested as directed by your health care provider. Do I need screening for cancer? Depending on your health history and family history, you may need to have cancer screening at different stages of your life. This may include screening  for:  Breast cancer.  Cervical cancer.  Lung cancer.  Colorectal cancer. What is my risk for osteoporosis? After menopause, you may be at increased risk for osteoporosis. Osteoporosis is a condition in which bone destruction happens more quickly than new bone creation. To help prevent osteoporosis or the bone fractures that can happen because of osteoporosis, you may take the following actions:  If you are 18-51 years old, get at least 1,000 mg of calcium and at least 600 mg of vitamin D per day.  If you are older than age 74 but younger than age 62, get at least 1,200 mg of calcium and at least 600 mg of vitamin D per day.  If you are older than age 56, get at least 1,200 mg of calcium and at least 800 mg of vitamin D per day. Smoking and drinking excessive alcohol increase the risk of osteoporosis. Eat foods that are rich in calcium and vitamin D, and do weight-bearing exercises several times each week as directed by your health care provider. How does menopause affect my mental health? Depression may occur at any age, but it is more common as you become older. Common symptoms of depression include:  Low or sad mood.  Changes in sleep patterns.  Changes in appetite or eating patterns.  Feeling an overall lack of motivation or enjoyment of activities that you previously enjoyed.  Frequent crying spells. Talk with your health care provider if you think that you are experiencing depression. General instructions See your health care provider for regular wellness exams and vaccines. This may include:  Scheduling regular health, dental, and eye exams.  Getting and maintaining your vaccines. These include: ? Influenza vaccine. Get this vaccine each year before the flu season begins. ? Pneumonia vaccine. ? Shingles vaccine. ? Tetanus, diphtheria, and pertussis (Tdap) booster vaccine. Your health care provider may also recommend other immunizations. Tell your health care provider  if you have ever been abused or do not feel safe at home. Summary  Menopause is a normal process in which your ability to get pregnant comes to an end.  This condition causes hot flashes, night sweats, decreased interest in sex, mood swings, headaches, or lack of sleep.  Treatment for this condition may include hormone replacement therapy.  Take actions to keep yourself healthy, including exercising regularly, eating a healthy diet, watching your weight, and checking your blood pressure and blood sugar levels.  Get screened for cancer and depression. Make sure that you are up to date with all your vaccines. This information is not intended to replace advice given to you by your health care provider. Make sure you discuss any questions you have with your health care provider. Document Revised: 12/04/2018 Document Reviewed: 12/04/2018 Elsevier Patient Education  2020 [employer].

## 2020-04-08 LAB — CYTOLOGY - PAP: Diagnosis: NEGATIVE

## 2020-06-08 ENCOUNTER — Ambulatory Visit: Payer: 59 | Attending: Internal Medicine

## 2020-06-08 DIAGNOSIS — Z23 Encounter for immunization: Secondary | ICD-10-CM

## 2020-06-08 NOTE — Progress Notes (Signed)
   Covid-19 Vaccination Clinic  Name:  Laura Gray    MRN: [id number] DOB: 1966/12/04  06/08/2020  [name] was observed post Covid-19 immunization for 15 minutes without incident. She was provided with Vaccine Information Sheet and instruction to access the V-Safe system.   [name] was instructed to call 911 with any severe reactions post vaccine: Marland Kitchen Difficulty breathing  . Swelling of face and throat  . A fast heartbeat  . A bad rash all over body  . Dizziness and weakness   Immunizations Administered    Name Date Dose VIS Date Route   Pfizer COVID-19 Vaccine 06/08/2020 10:22 AM 0.3 mL 02/18/2019 Intramuscular   Manufacturer: [place]   Lot: [id number]   [place]: [id number]

## 2020-06-29 ENCOUNTER — Ambulatory Visit: Payer: 59 | Attending: Internal Medicine

## 2020-06-29 DIAGNOSIS — Z23 Encounter for immunization: Secondary | ICD-10-CM

## 2020-06-29 NOTE — Progress Notes (Signed)
   Covid-19 Vaccination Clinic  Name:  Laura Gray    MRN: [id number] DOB: 1966-03-28  06/29/2020  Laura Gray was observed post Covid-19 immunization for 15 minutes without incident. She was provided with Vaccine Information Sheet and instruction to access the V-Safe system.   Laura Gray was instructed to call 911 with any severe reactions post vaccine: Marland Kitchen Difficulty breathing  . Swelling of face and throat  . A fast heartbeat  . A bad rash all over body  . Dizziness and weakness   Immunizations Administered    Name Date Dose VIS Date Route   Pfizer COVID-19 Vaccine 06/29/2020  2:25 PM 0.3 mL 02/18/2019 Intramuscular   Manufacturer: [place]   Lot: [id number]   [place]: [id number]

## 2020-11-17 ENCOUNTER — Ambulatory Visit: Payer: 59 | Admitting: Internal Medicine

## 2020-11-23 ENCOUNTER — Other Ambulatory Visit: Payer: Self-pay

## 2020-11-23 ENCOUNTER — Encounter: Payer: Self-pay | Admitting: Internal Medicine

## 2020-11-23 ENCOUNTER — Ambulatory Visit: Payer: 59 | Admitting: Internal Medicine

## 2020-11-23 ENCOUNTER — Other Ambulatory Visit: Payer: Self-pay | Admitting: Internal Medicine

## 2020-11-23 VITALS — BP 126/72 | HR 68 | Temp 98.4°F | Ht 64.0 in | Wt 290.4 lb

## 2020-11-23 DIAGNOSIS — R7301 Impaired fasting glucose: Secondary | ICD-10-CM | POA: Diagnosis not present

## 2020-11-23 DIAGNOSIS — I10 Essential (primary) hypertension: Secondary | ICD-10-CM

## 2020-11-23 DIAGNOSIS — F411 Generalized anxiety disorder: Secondary | ICD-10-CM | POA: Diagnosis not present

## 2020-11-23 MED ORDER — BUSPIRONE HCL 30 MG PO TABS: 30.0000 mg | ORAL_TABLET | Freq: Two times a day (BID) | ORAL | 3 refills | Status: DC

## 2020-11-23 MED ORDER — TELMISARTAN 20 MG PO TABS: 20.0000 mg | ORAL_TABLET | Freq: Every day | ORAL | 1 refills | Status: DC

## 2020-11-23 MED ORDER — FLUOXETINE HCL 40 MG PO CAPS: 40.0000 mg | ORAL_CAPSULE | Freq: Every day | ORAL | 1 refills | Status: DC

## 2020-11-23 NOTE — Assessment & Plan Note (Signed)
I have encouarged her to work on  reduction of   BMI over the next 6 months using a low glycemic index diet  and regular exercise a minimum of 5 days per week.

## 2020-11-23 NOTE — Progress Notes (Signed)
Subjective:  Patient ID: Laura Gray, female    DOB: 04-15-1966  Age: 54 y.o. MRN: [id number]  CC: The primary encounter diagnosis was Primary hypertension. Diagnoses of Generalized anxiety disorder, Impaired fasting glucose, and Morbid obesity ([place]) were also pertinent to this visit.  HPI Laura Gray presents for follow up on GAD , hypertension and morbid obesity  This visit occurred during the SARS-CoV-2 public health emergency.  Safety protocols were in place, including screening questions prior to the visit, additional usage of staff PPE, and extensive cleaning of exam room while observing appropriate contact time as indicated for disinfecting solutions.   prozac and buspar in the morning  buspar and BP med in the evening.  Working. nights as ER nurse  Using less melatonin .  Family conflicts have improved,  Work has not.    Weight gain:  Emotional eater   daughter back home and baking a lot.   Not walking or exercising .  Daughter is starting a new  job at [employer] so planning to start exercising     Outpatient Medications Prior to Visit  Medication Sig Dispense Refill  . cetirizine (ZYRTEC) 10 MG tablet Take 10 mg by mouth daily.    . Melatonin 1 MG CAPS Take 1 capsule by mouth at bedtime as needed.    . busPIRone (BUSPAR) 30 MG tablet Take 1 tablet (30 mg total) by mouth 2 (two) times daily. 180 tablet 3  . FLUoxetine (PROZAC) 40 MG capsule Take 1 capsule (40 mg total) by mouth daily. 90 capsule 1  . telmisartan (MICARDIS) 20 MG tablet Take 1 tablet (20 mg total) by mouth at bedtime. 90 tablet 1   No facility-administered medications prior to visit.    Review of Systems;  Patient denies headache, fevers, malaise, unintentional weight loss, skin rash, eye pain, sinus congestion and sinus pain, sore throat, dysphagia,  hemoptysis , cough, dyspnea, wheezing, chest pain, palpitations, orthopnea, edema, abdominal pain, nausea, melena, diarrhea, constipation,  flank pain, dysuria, hematuria, urinary  Frequency, nocturia, numbness, tingling, seizures,  Focal weakness, Loss of consciousness,  Tremor, insomnia, depression, anxiety, and suicidal ideation.      Objective:  BP 126/72   Pulse 68   Temp 98.4 F (36.9 C)   Ht 5\' 4"  (1.626 m)   Wt 290 lb 6.4 oz (131.7 kg)   LMP 07/24/2015   SpO2 98%   BMI 49.85 kg/m   BP Readings from Last 3 Encounters:  11/23/20 126/72  04/07/20 124/86  02/26/20 124/76    Wt Readings from Last 3 Encounters:  11/23/20 290 lb 6.4 oz (131.7 kg)  04/07/20 284 lb 6.4 oz (129 kg)  02/26/20 282 lb 6.4 oz (128.1 kg)    General appearance: alert, cooperative and appears stated age Ears: normal TM's and external ear canals both ears Throat: lips, mucosa, and tongue normal; teeth and gums normal Neck: no adenopathy, no carotid bruit, supple, symmetrical, trachea midline and thyroid not enlarged, symmetric, no tenderness/mass/nodules Back: symmetric, no curvature. ROM normal. No CVA tenderness. Lungs: clear to auscultation bilaterally Heart: regular rate and rhythm, S1, S2 normal, no murmur, click, rub or gallop Abdomen: soft, non-tender; bowel sounds normal; no masses,  no organomegaly Pulses: 2+ and symmetric Skin: Skin color, texture, turgor normal. No rashes or lesions Lymph nodes: Cervical, supraclavicular, and axillary nodes normal.  Lab Results  Component Value Date   HGBA1C 5.5 02/26/2020   HGBA1C 5.5 08/28/2019   HGBA1C 5.3 01/22/2019  Lab Results  Component Value Date   CREATININE 0.87 02/26/2020   CREATININE 0.81 08/28/2019   CREATININE 0.81 01/22/2019    Lab Results  Component Value Date   WBC 7.3 06/17/2013   HGB 13.6 06/17/2013   HCT 39.1 06/17/2013   PLT 255.0 06/17/2013   GLUCOSE 124 (H) 02/26/2020   CHOL 214 (H) 02/26/2020   TRIG 101.0 02/26/2020   HDL 56.20 02/26/2020   LDLDIRECT 145.6 09/18/2013   LDLCALC 138 (H) 02/26/2020   ALT 19 02/26/2020   AST 15 02/26/2020   NA 135  02/26/2020   K 4.0 02/26/2020   CL 101 02/26/2020   CREATININE 0.87 02/26/2020   BUN 13 02/26/2020   CO2 29 02/26/2020   TSH 0.63 01/22/2019   HGBA1C 5.5 02/26/2020   MICROALBUR 1.4 02/26/2020    No results found.  Assessment & Plan:   Problem List Items Addressed This Visit      Unprioritized   Morbid obesity ([place])    I have encouarged her to work on  reduction of   BMI over the next 6 months using a low glycemic index diet  and regular exercise a minimum of 5 days per week.        Impaired fasting glucose    a1c was normal.  Low GI diet advised,  Weight loss through diet and exercise advised       Hypertension - Primary    Well controlled on current regimen of telmisartan 20 mg daily  Renal function stable, no changes today.      Relevant Orders   Basic metabolic panel   Generalized anxiety disorder    Better controlled with current regimen of prozac 40 mg qd and buspirone bid.  No changes today         I am having Yukie L. Raupp maintain her cetirizine and Melatonin.  No orders of the defined types were placed in this encounter.   There are no discontinued medications.  Follow-up: No follow-ups on file.   Crecencio Mc, MD

## 2020-11-23 NOTE — Assessment & Plan Note (Signed)
Better controlled with current regimen of prozac 40 mg qd and buspirone bid.  No changes today

## 2020-11-23 NOTE — Assessment & Plan Note (Signed)
a1c was normal.  Low GI diet advised,  Weight loss through diet and exercise advised

## 2020-11-23 NOTE — Assessment & Plan Note (Addendum)
Well controlled on current regimen of telmisartan 20 mg daily . Renal function stable, no changes today. 

## 2020-11-23 NOTE — Patient Instructions (Signed)
Glad things are better   Continue current meds. See you in 6 months

## 2020-11-24 LAB — BASIC METABOLIC PANEL
BUN: 10 mg/dL (ref 6–23)
CO2: 27 mEq/L (ref 19–32)
Calcium: 9.5 mg/dL (ref 8.4–10.5)
Chloride: 99 mEq/L (ref 96–112)
Creatinine, Ser: 0.87 mg/dL (ref 0.40–1.20)
GFR: 75.49 mL/min (ref >60.00)
Glucose, Bld: 89 mg/dL (ref 70–99)
Potassium: 4.2 mEq/L (ref 3.5–5.1)
Sodium: 134 mEq/L — ABNORMAL LOW (ref 135–145)

## 2020-11-24 NOTE — Progress Notes (Signed)
Your kidney function and potassium are normal and unchanged.  You can continue your current medications as long as you are tolerating them and follow up with me in 6 months .   Regards,  Dr. Derrel Nip

## 2021-04-06 ENCOUNTER — Other Ambulatory Visit: Payer: Self-pay

## 2021-04-06 MED FILL — Telmisartan Tab 20 MG: ORAL | 90 days supply | Qty: 90 | Fill #0 | Status: AC

## 2021-04-06 MED FILL — Buspirone HCl Tab 30 MG: ORAL | 90 days supply | Qty: 180 | Fill #0 | Status: AC

## 2021-04-06 MED FILL — Fluoxetine HCl Cap 40 MG: ORAL | 90 days supply | Qty: 90 | Fill #0 | Status: AC

## 2021-04-07 ENCOUNTER — Other Ambulatory Visit: Payer: Self-pay

## 2021-05-25 ENCOUNTER — Ambulatory Visit (INDEPENDENT_AMBULATORY_CARE_PROVIDER_SITE_OTHER): Payer: 59 | Admitting: Internal Medicine

## 2021-05-25 ENCOUNTER — Other Ambulatory Visit: Payer: Self-pay

## 2021-05-25 ENCOUNTER — Other Ambulatory Visit (HOSPITAL_COMMUNITY)
Admission: RE | Admit: 2021-05-25 | Discharge: 2021-05-25 | Disposition: A | Discharge status: Home / Self Care | Payer: 59 | Source: Ambulatory Visit | Attending: Internal Medicine | Admitting: Internal Medicine

## 2021-05-25 ENCOUNTER — Encounter: Payer: Self-pay | Admitting: Internal Medicine

## 2021-05-25 VITALS — BP 124/86 | HR 74 | Temp 96.3°F | Resp 15 | Ht 64.0 in | Wt 292.4 lb

## 2021-05-25 DIAGNOSIS — F411 Generalized anxiety disorder: Secondary | ICD-10-CM

## 2021-05-25 DIAGNOSIS — E559 Vitamin D deficiency, unspecified: Secondary | ICD-10-CM | POA: Diagnosis not present

## 2021-05-25 DIAGNOSIS — I10 Essential (primary) hypertension: Secondary | ICD-10-CM | POA: Diagnosis not present

## 2021-05-25 DIAGNOSIS — Z91B Personal risk factor of exposure to diethylstilbestrol: Secondary | ICD-10-CM

## 2021-05-25 DIAGNOSIS — Z0000 Encounter for general adult medical examination without abnormal findings: Secondary | ICD-10-CM

## 2021-05-25 DIAGNOSIS — R7303 Prediabetes: Secondary | ICD-10-CM | POA: Diagnosis not present

## 2021-05-25 DIAGNOSIS — Z1159 Encounter for screening for other viral diseases: Secondary | ICD-10-CM

## 2021-05-25 DIAGNOSIS — I152 Hypertension secondary to endocrine disorders: Secondary | ICD-10-CM

## 2021-05-25 DIAGNOSIS — E782 Mixed hyperlipidemia: Secondary | ICD-10-CM

## 2021-05-25 DIAGNOSIS — R7301 Impaired fasting glucose: Secondary | ICD-10-CM

## 2021-05-25 DIAGNOSIS — E1159 Type 2 diabetes mellitus with other circulatory complications: Secondary | ICD-10-CM

## 2021-05-25 DIAGNOSIS — Z124 Encounter for screening for malignant neoplasm of cervix: Secondary | ICD-10-CM | POA: Diagnosis not present

## 2021-05-25 DIAGNOSIS — E669 Obesity, unspecified: Secondary | ICD-10-CM

## 2021-05-25 DIAGNOSIS — E119 Type 2 diabetes mellitus without complications: Secondary | ICD-10-CM

## 2021-05-25 DIAGNOSIS — Z1231 Encounter for screening mammogram for malignant neoplasm of breast: Secondary | ICD-10-CM

## 2021-05-25 DIAGNOSIS — Z9189 Other specified personal risk factors, not elsewhere classified: Secondary | ICD-10-CM | POA: Diagnosis not present

## 2021-05-25 DIAGNOSIS — E1169 Type 2 diabetes mellitus with other specified complication: Secondary | ICD-10-CM

## 2021-05-25 LAB — COMPREHENSIVE METABOLIC PANEL
ALT: 24 U/L (ref 0–35)
AST: 15 U/L (ref 0–37)
Albumin: 3.9 g/dL (ref 3.5–5.2)
Alkaline Phosphatase: 78 U/L (ref 39–117)
BUN: 10 mg/dL (ref 6–23)
CO2: 26 mEq/L (ref 19–32)
Calcium: 9.4 mg/dL (ref 8.4–10.5)
Chloride: 102 mEq/L (ref 96–112)
Creatinine, Ser: 0.86 mg/dL (ref 0.40–1.20)
GFR: 76.27 mL/min (ref >60.00)
Glucose, Bld: 142 mg/dL — ABNORMAL HIGH (ref 70–99)
Potassium: 4.3 mEq/L (ref 3.5–5.1)
Sodium: 138 mEq/L (ref 135–145)
Total Bilirubin: 0.6 mg/dL (ref 0.2–1.2)
Total Protein: 6.5 g/dL (ref 6.0–8.3)

## 2021-05-25 LAB — LIPID PANEL
Cholesterol: 209 mg/dL — ABNORMAL HIGH (ref 0–200)
HDL: 60.5 mg/dL (ref >39.00)
LDL Cholesterol: 130 mg/dL — ABNORMAL HIGH (ref 0–99)
NonHDL: 148.01
Total CHOL/HDL Ratio: 3
Triglycerides: 89 mg/dL (ref 0.0–149.0)
VLDL: 17.8 mg/dL (ref 0.0–40.0)

## 2021-05-25 LAB — HEMOGLOBIN A1C: Hgb A1c MFr Bld: 6.4 % (ref 4.6–6.5)

## 2021-05-25 LAB — TSH: TSH: 1.46 u[IU]/mL (ref 0.35–4.50)

## 2021-05-25 MED ORDER — TELMISARTAN 20 MG PO TABS
ORAL_TABLET | Freq: Every day | ORAL | 1 refills | Status: DC
  Filled 2021-05-25 – 2021-08-05 (×2): qty 90, 90d supply, fill #0
  Filled 2021-11-22: qty 90, 90d supply, fill #1

## 2021-05-25 MED ORDER — FLUOXETINE HCL 40 MG PO CAPS
ORAL_CAPSULE | Freq: Every day | ORAL | 1 refills | Status: DC
  Filled 2021-05-25 – 2021-08-05 (×2): qty 90, 90d supply, fill #0
  Filled 2021-11-22: qty 90, 90d supply, fill #1

## 2021-05-25 MED ORDER — ZOSTER VAC RECOMB ADJUVANTED 50 MCG/0.5ML IM SUSR: 0.5000 mL | Freq: Once | INTRAMUSCULAR | 1 refills | Status: AC

## 2021-05-25 NOTE — Progress Notes (Signed)
Patient ID: [name], female    DOB: 01-19-1966  Age: 55 y.o. MRN: [id number]  The patient is here for annual preventive  examination and management of other chronic and acute problems.   The risk factors are reflected in the social history.  The roster of all physicians providing medical care to patient - is listed in the Snapshot section of the chart.  Activities of daily living:  The patient is 100% independent in all ADLs: dressing, toileting, feeding as well as independent mobility  Home safety : The patient has smoke detectors in the home. They wear seatbelts.  There are no firearms at home. There is no violence in the home.   There is no risks for hepatitis, STDs or HIV. There is no   history of blood transfusion. They have no travel history to infectious disease endemic areas of the world.  The patient has seen their dentist in the last six month. They have seen their eye doctor in the last year. They d deny hearing difficulty with regard to whispered voices and some television programs.  They have deferred audiologic testing in the last year.  They do not  have excessive sun exposure. Discussed the need for sun protection: hats, long sleeves and use of sunscreen if there is significant sun exposure.   Diet: the importance of a healthy diet is discussed. She does not follow a low GI diet or exercise regularly and has gained weight.   The benefits of regular aerobic exercise were discussed.   Depression screen: there are no signs or vegative symptoms of depression- irritability, change in appetite, anhedonia, sadness/tearfullness.  Cognitive assessment: the patient manages all their financial and personal affairs and is actively engaged. They could relate day,date,year and events; recalled 2/3 objects at 3 minutes; performed clock-face test normally.  The following portions of the patient's history were reviewed and updated as appropriate: allergies, current medications, past  family history, past medical history,  past surgical history, past social history  and problem list.  Visual acuity was not assessed per patient preference since she has regular follow up with her ophthalmologist. Hearing and body mass index were assessed and reviewed.   During the course of the visit the patient was educated and counseled about appropriate screening and preventive services including : fall prevention , diabetes screening, nutrition counseling, colorectal cancer screening, and recommended immunizations.    CC: The primary encounter diagnosis was Encounter for preventive health examination. Diagnoses of Cervical cancer screening, Impaired fasting glucose, Primary hypertension, Vitamin D deficiency, Moderate mixed hyperlipidemia not requiring statin therapy, Encounter for hepatitis C screening test for low risk patient, Encounter for screening mammogram for malignant neoplasm of breast, DES exposure in utero, Prediabetes, Morbid obesity ([place]), Generalized anxiety disorder, and Obesity, diabetes, and hypertension syndrome ([place]) were also pertinent to this visit.  History Desani has a past medical history of Depression and Hypertension.   She has a past surgical history that includes Dilation and curettage of uterus (1989); exploratory (2000); and Colonoscopy with propofol (N/A, 04/02/2018).   Her family history includes Breast cancer in her maternal grandmother; Cancer in her maternal grandfather, maternal grandmother, paternal aunt, paternal grandfather, and paternal uncle; Dementia in her maternal grandmother; Hyperlipidemia in her father; Hypertension in her maternal grandfather and mother.She reports that she has never smoked. She has never used smokeless tobacco. She reports that she does not drink alcohol and does not use drugs.  Outpatient Medications Prior to Visit  Medication Sig Dispense Refill  .  busPIRone (BUSPAR) 30 MG tablet TAKE 1 TABLET BY MOUTH TWO TIMES DAILY 180 tablet 3   . cetirizine (ZYRTEC) 10 MG tablet Take 10 mg by mouth as needed.    . Melatonin 1 MG CAPS Take 1 capsule by mouth at bedtime as needed.    Marland Kitchen FLUoxetine (PROZAC) 40 MG capsule TAKE 1 CAPSULE BY MOUTH DAILY 90 capsule 1  . telmisartan (MICARDIS) 20 MG tablet TAKE 1 TABLET BY MOUTH AT BEDTIME 90 tablet 1   No facility-administered medications prior to visit.    Review of Systems   Patient denies headache, fevers, malaise, unintentional weight loss, skin rash, eye pain, sinus congestion and sinus pain, sore throat, dysphagia,  hemoptysis , cough, dyspnea, wheezing, chest pain, palpitations, orthopnea, edema, abdominal pain, nausea, melena, diarrhea, constipation, flank pain, dysuria, hematuria, urinary  Frequency, nocturia, numbness, tingling, seizures,  Focal weakness, Loss of consciousness,  Tremor, insomnia, depression, anxiety, and suicidal ideation.      Objective:  BP 124/86 (BP Location: Left Arm, Patient Position: Sitting, Cuff Size: Large)   Pulse 74   Temp (!) 96.3 F (35.7 C) (Temporal)   Resp 15   Ht 5\' 4"  (1.626 m)   Wt 292 lb 6.4 oz (132.6 kg)   LMP 07/24/2015   SpO2 97%   BMI 50.19 kg/m   Physical Exam  General appearance: alert, cooperative and appears stated age Head: Normocephalic, without obvious abnormality, atraumatic Eyes: conjunctivae/corneas clear. PERRL, EOM's intact. Fundi benign. Ears: normal TM's and external ear canals both ears Nose: Nares normal. Septum midline. Mucosa normal. No drainage or sinus tenderness. Throat: lips, mucosa, and tongue normal; teeth and gums normal Neck: no adenopathy, no carotid bruit, no JVD, supple, symmetrical, trachea midline and thyroid not enlarged, symmetric, no tenderness/mass/nodules Lungs: clear to auscultation bilaterally Breasts: normal appearance, no masses or tenderness Heart: regular rate and rhythm, S1, S2 normal, no murmur, click, rub or gallop Abdomen: soft, non-tender; bowel sounds normal; no masses,  no  organomegaly Extremities: extremities normal, atraumatic, no cyanosis or edema Pulses: 2+ and symmetric Skin: Skin color, texture, turgor normal. No rashes or lesions Neurologic: Alert and oriented X 3, normal strength and tone. Normal symmetric reflexes. Normal coordination and gait.    Assessment & Plan:   Problem List Items Addressed This Visit      Unprioritized   DES exposure in utero    Annual PAP smears have been normal      Encounter for preventive health examination - Primary    age appropriate education and counseling updated, referrals for preventative services and immunizations addressed, dietary and smoking counseling addressed, most recent labs reviewed.  I have personally reviewed and have noted:  1) the patient's medical and social history 2) The pt's use of alcohol, tobacco, and illicit drugs 3) The patient's current medications and supplements 4) Functional ability including ADL's, fall risk, home safety risk, hearing and visual impairment 5) Diet and physical activities 6) Evidence for depression or mood disorder 7) The patient's height, weight, and BMI have been recorded in the chart  I have made referrals, and provided counseling and education based on review of the above      Generalized anxiety disorder    Better controlled with current regimen of prozac 40 mg qd and buspirone bid.  No changes today      Relevant Medications   FLUoxetine (PROZAC) 40 MG capsule   Hypertension    Well controlled on current regimen of telmisartan 20 mg daily  Renal  function stable, no changes today.  Lab Results  Component Value Date   CREATININE 0.86 05/25/2021   Lab Results  Component Value Date   NA 138 05/25/2021   K 4.3 05/25/2021   CL 102 05/25/2021   CO2 26 05/25/2021         Relevant Medications   telmisartan (MICARDIS) 20 MG tablet   Other Relevant Orders   Comprehensive metabolic panel (Completed)   Moderate mixed hyperlipidemia not requiring  statin therapy   Relevant Medications   telmisartan (MICARDIS) 20 MG tablet   Other Relevant Orders   Lipid panel (Completed)   TSH (Completed)   Morbid obesity ([place])    I have encouraged her to work on  reduction of   BMI over the next 6 months using a low glycemic index diet  and regular exercise a minimum of 5 days per week.        Obesity, diabetes, and hypertension syndrome ([place])     New diagnosis of type 2 DM with fasting glucose of 142,  a1c 6.4  Will recommend initiation of metformin,  ozempic and statin for management of morbid obesity .  Lab Results  Component Value Date   HGBA1C 6.4 05/25/2021   Lab Results  Component Value Date   MICROALBUR 1.4 02/26/2020   MICROALBUR 1.4 01/16/2018    Lab Results  Component Value Date   CHOL 209 (H) 05/25/2021   HDL 60.50 05/25/2021   LDLCALC 130 (H) 05/25/2021   LDLDIRECT 145.6 09/18/2013   TRIG 89.0 05/25/2021   CHOLHDL 3 05/25/2021          Relevant Medications   telmisartan (MICARDIS) 20 MG tablet   Screening for breast cancer   Relevant Orders   MM 3D SCREEN BREAST BILATERAL   Vitamin D deficiency    Other Visit Diagnoses    Cervical cancer screening       Relevant Orders   Cytology - PAP (Completed)   Encounter for hepatitis C screening test for low risk patient       Relevant Orders   Hepatitis C antibody (Completed)      I am having Hector L. Mehlberg start on Zoster Vaccine Adjuvanted. I am also having her maintain her cetirizine, Melatonin, busPIRone, FLUoxetine, and telmisartan.  Meds ordered this encounter  Medications  . Zoster Vaccine Adjuvanted [facility]) injection    Sig: Inject 0.5 mLs into the muscle once for 1 dose.    Dispense:  1 each    Refill:  1  . FLUoxetine (PROZAC) 40 MG capsule    Sig: TAKE 1 CAPSULE BY MOUTH DAILY    Dispense:  90 capsule    Refill:  1  . telmisartan (MICARDIS) 20 MG tablet    Sig: TAKE 1 TABLET BY MOUTH AT BEDTIME    Dispense:  90 tablet    Refill:  1     Medications Discontinued During This Encounter  Medication Reason  . telmisartan (MICARDIS) 20 MG tablet Reorder  . FLUoxetine (PROZAC) 40 MG capsule Reorder    Follow-up: No follow-ups on file.   Crecencio Mc, MD

## 2021-05-25 NOTE — Patient Instructions (Signed)
We will continue annual PAP smears for cervical CA screening bc of your history of DES exposure in utero    The ShingRx vaccine is now available in local pharmacies and is much more protective than the old one  Zostavax  (it is about 97%  Effective in preventing shingles). .   It is therefore ADVISED for all interested adults over 50 to prevent shingles so I have printed you a prescription for it.  (it requires a 2nd dose 2 too 6 months after the first one) .  It will cause you to have flu  like symptoms for 2 days    MAMMOGRAM IS ORDERED (AGAIN!!!)  NEXT COLONOSCOPY IS DUE IN 2025 (5 YR INTERVAL)   Health Maintenance for Postmenopausal Women Menopause is a normal process in which your ability to get pregnant comes to an end. This process happens slowly over many months or years, usually between the ages of 13 and 55. Menopause is complete when you have missed your menstrual periods for 12 months. It is important to talk with your health care provider about some of the most common conditions that affect women after menopause (postmenopausal women). These include heart disease, cancer, and bone loss (osteoporosis). Adopting a healthy lifestyle and getting preventive care can help to promote your health and wellness. The actions you take can also lower your chances of developing some of these common conditions. What should I know about menopause? During menopause, you may get a number of symptoms, such as:  Hot flashes. These can be moderate or severe.  Night sweats.  Decrease in sex drive.  Mood swings.  Headaches.  Tiredness.  Irritability.  Memory problems.  Insomnia. Choosing to treat or not to treat these symptoms is a decision that you make with your health care provider. Do I need hormone replacement therapy?  Hormone replacement therapy is effective in treating symptoms that are caused by menopause, such as hot flashes and night sweats.  Hormone replacement carries  certain risks, especially as you become older. If you are thinking about using estrogen or estrogen with progestin, discuss the benefits and risks with your health care provider. What is my risk for heart disease and stroke? The risk of heart disease, heart attack, and stroke increases as you age. One of the causes may be a change in the body's hormones during menopause. This can affect how your body uses dietary fats, triglycerides, and cholesterol. Heart attack and stroke are medical emergencies. There are many things that you can do to help prevent heart disease and stroke. Watch your blood pressure  High blood pressure causes heart disease and increases the risk of stroke. This is more likely to develop in people who have high blood pressure readings, are of African descent, or are overweight.  Have your blood pressure checked: ? Every 3-5 years if you are 55-72 years of age. ? Every year if you are 55 years old or older. Eat a healthy diet  Eat a diet that includes plenty of vegetables, fruits, low-fat dairy products, and lean protein.  Do not eat a lot of foods that are high in solid fats, added sugars, or sodium.   Get regular exercise Get regular exercise. This is one of the most important things you can do for your health. Most adults should:  Try to exercise for at least 150 minutes each week. The exercise should increase your heart rate and make you sweat (moderate-intensity exercise).  Try to do strengthening exercises at least  twice each week. Do these in addition to the moderate-intensity exercise.  Spend less time sitting. Even light physical activity can be beneficial. Other tips  Work with your health care provider to achieve or maintain a healthy weight.  Do not use any products that contain nicotine or tobacco, such as cigarettes, e-cigarettes, and chewing tobacco. If you need help quitting, ask your health care provider.  Know your numbers. Ask your health care  provider to check your cholesterol and your blood sugar (glucose). Continue to have your blood tested as directed by your health care provider. Do I need screening for cancer? Depending on your health history and family history, you may need to have cancer screening at different stages of your life. This may include screening for:  Breast cancer.  Cervical cancer.  Lung cancer.  Colorectal cancer. What is my risk for osteoporosis? After menopause, you may be at increased risk for osteoporosis. Osteoporosis is a condition in which bone destruction happens more quickly than new bone creation. To help prevent osteoporosis or the bone fractures that can happen because of osteoporosis, you may take the following actions:  If you are 3-55 years old, get at least 1,000 mg of calcium and at least 600 mg of vitamin D per day.  If you are older than age 55 but younger than age 85, get at least 1,200 mg of calcium and at least 600 mg of vitamin D per day.  If you are older than age 21, get at least 1,200 mg of calcium and at least 800 mg of vitamin D per day. Smoking and drinking excessive alcohol increase the risk of osteoporosis. Eat foods that are rich in calcium and vitamin D, and do weight-bearing exercises several times each week as directed by your health care provider. How does menopause affect my mental health? Depression may occur at any age, but it is more common as you become older. Common symptoms of depression include:  Low or sad mood.  Changes in sleep patterns.  Changes in appetite or eating patterns.  Feeling an overall lack of motivation or enjoyment of activities that you previously enjoyed.  Frequent crying spells. Talk with your health care provider if you think that you are experiencing depression. General instructions See your health care provider for regular wellness exams and vaccines. This may include:  Scheduling regular health, dental, and eye exams.  Getting  and maintaining your vaccines. These include: ? Influenza vaccine. Get this vaccine each year before the flu season begins. ? Pneumonia vaccine. ? Shingles vaccine. ? Tetanus, diphtheria, and pertussis (Tdap) booster vaccine. Your health care provider may also recommend other immunizations. Tell your health care provider if you have ever been abused or do not feel safe at home. Summary  Menopause is a normal process in which your ability to get pregnant comes to an end.  This condition causes hot flashes, night sweats, decreased interest in sex, mood swings, headaches, or lack of sleep.  Treatment for this condition may include hormone replacement therapy.  Take actions to keep yourself healthy, including exercising regularly, eating a healthy diet, watching your weight, and checking your blood pressure and blood sugar levels.  Get screened for cancer and depression. Make sure that you are up to date with all your vaccines. This information is not intended to replace advice given to you by your health care provider. Make sure you discuss any questions you have with your health care provider. Document Revised: 12/04/2018 Document Reviewed: 12/04/2018 Elsevier  Elsevier Patient Education  2021 [employer]   

## 2021-05-26 LAB — CYTOLOGY - PAP
Adequacy: ABSENT
Comment: NEGATIVE
Diagnosis: NEGATIVE
High risk HPV: NEGATIVE

## 2021-05-26 LAB — HEPATITIS C ANTIBODY
Hepatitis C Ab: NONREACTIVE
SIGNAL TO CUT-OFF: 0 (ref <1.00)

## 2021-05-26 NOTE — Assessment & Plan Note (Addendum)
New diagnosis of type 2 DM with fasting glucose of 142,  a1c 6.4  Will recommend initiation of metformin,  ozempic and statin for management of morbid obesity .  Lab Results  Component Value Date   HGBA1C 6.4 05/25/2021   Lab Results  Component Value Date   MICROALBUR 1.4 02/26/2020   MICROALBUR 1.4 01/16/2018    Lab Results  Component Value Date   CHOL 209 (H) 05/25/2021   HDL 60.50 05/25/2021   LDLCALC 130 (H) 05/25/2021   LDLDIRECT 145.6 09/18/2013   TRIG 89.0 05/25/2021   CHOLHDL 3 05/25/2021

## 2021-05-26 NOTE — Assessment & Plan Note (Signed)

## 2021-05-26 NOTE — Assessment & Plan Note (Signed)
I have encouraged her to work on  reduction of   BMI over the next 6 months using a low glycemic index diet  and regular exercise a minimum of 5 days per week.

## 2021-05-26 NOTE — Assessment & Plan Note (Signed)
Better controlled with current regimen of prozac 40 mg qd and buspirone bid.  No changes today

## 2021-05-26 NOTE — Assessment & Plan Note (Signed)
Annual PAP smears have been normal

## 2021-05-26 NOTE — Assessment & Plan Note (Signed)
Well controlled on current regimen of telmisartan 20 mg daily  Renal function stable, no changes today.  Lab Results  Component Value Date   CREATININE 0.86 05/25/2021   Lab Results  Component Value Date   NA 138 05/25/2021   K 4.3 05/25/2021   CL 102 05/25/2021   CO2 26 05/25/2021

## 2021-06-15 ENCOUNTER — Other Ambulatory Visit: Payer: Self-pay

## 2021-06-15 ENCOUNTER — Encounter: Payer: Self-pay | Admitting: Internal Medicine

## 2021-06-15 ENCOUNTER — Ambulatory Visit: Payer: 59 | Admitting: Internal Medicine

## 2021-06-15 VITALS — BP 124/78 | HR 91 | Temp 98.1°F | Ht 64.0 in | Wt 287.0 lb

## 2021-06-15 DIAGNOSIS — E1159 Type 2 diabetes mellitus with other circulatory complications: Secondary | ICD-10-CM | POA: Diagnosis not present

## 2021-06-15 DIAGNOSIS — I152 Hypertension secondary to endocrine disorders: Secondary | ICD-10-CM

## 2021-06-15 DIAGNOSIS — E1169 Type 2 diabetes mellitus with other specified complication: Secondary | ICD-10-CM

## 2021-06-15 DIAGNOSIS — E669 Obesity, unspecified: Secondary | ICD-10-CM | POA: Diagnosis not present

## 2021-06-15 DIAGNOSIS — Z23 Encounter for immunization: Secondary | ICD-10-CM | POA: Diagnosis not present

## 2021-06-15 NOTE — Patient Instructions (Signed)
[employer] smart  fudgsicles and ice cream bars are low carb and allowable daily if you need ice cream    [employer] "low carb power bowl"  entrees and  "Steamer" entrees are are great low carb entrees that microwave in 5 minutes       We'll repeat your A1c in 3 months   Semaglutide injection solution What is this medication? SEMAGLUTIDE (Sem a GLOO tide) is used to improve blood sugar control in adults with type 2 diabetes. This medicine may be used with other diabetes medicines. This drug may also reduce the risk of heart attack or stroke if you have type 2diabetes and risk factors for heart disease. This medicine may be used for other purposes; ask your health care provider orpharmacist if you have questions. COMMON BRAND NAME(S): OZEMPIC What should I tell my care team before I take this medication? They need to know if you have any of these conditions: endocrine tumors (MEN 2) or if someone in your family had these tumors eye disease, vision problems history of pancreatitis kidney disease stomach problems thyroid cancer or if someone in your family had thyroid cancer an unusual or allergic reaction to semaglutide, other medicines, foods, dyes, or preservatives pregnant or trying to get pregnant breast-feeding How should I use this medication? This medicine is for injection under the skin of your upper leg (thigh), stomach area, or upper arm. It is given once every week (every 7 days). You will be taught how to prepare and give this medicine. Use exactly as directed. Take your medicine at regular intervals. Do not take it more often thandirected. If you use this medicine with insulin, you should inject this medicine and the insulin separately. Do not mix them together. Do not give the injections rightnext to each other. Change (rotate) injection sites with each injection. It is important that you put your used needles and syringes in a special sharps container. Do not put them  in a trash can. If you do not have a sharpscontainer, call your pharmacist or healthcare provider to get one. A special MedGuide will be given to you by the pharmacist with eachprescription and refill. Be sure to read this information carefully each time. This drug comes with INSTRUCTIONS FOR USE. Ask your pharmacist for directions on how to use this drug. Read the information carefully. Talk to yourpharmacist or health care provider if you have questions. Talk to your pediatrician regarding the use of this medicine in children.Special care may be needed. Overdosage: If you think you have taken too much of this medicine contact apoison control center or emergency room at once. NOTE: This medicine is only for you. Do not share this medicine with others. What if I miss a dose? If you miss a dose, take it as soon as you can within 5 days after the missed dose. Then take your next dose at your regular weekly time. If it has been longer than 5 days after the missed dose, do not take the missed dose. Take the next dose at your regular time. Do not take double or extra doses. If you havequestions about a missed dose, contact your health care provider for advice. What may interact with this medication? other medicines for diabetes Many medications may cause changes in blood sugar, these include: alcohol containing beverages antiviral medicines for HIV or AIDS aspirin and aspirin-like drugs certain medicines for blood pressure, heart disease, irregular heart beat chromium diuretics female hormones, such as estrogens or progestins, birth  control pills fenofibrate gemfibrozil isoniazid lanreotide female hormones or anabolic steroids MAOIs like Carbex, Eldepryl, Marplan, Nardil, and Parnate medicines for weight loss medicines for allergies, asthma, cold, or cough medicines for depression, anxiety, or psychotic disturbances niacin nicotine NSAIDs, medicines for pain and inflammation, like ibuprofen or  naproxen octreotide pasireotide pentamidine phenytoin probenecid quinolone antibiotics such as ciprofloxacin, levofloxacin, ofloxacin some herbal dietary supplements steroid medicines such as prednisone or cortisone sulfamethoxazole; trimethoprim thyroid hormones Some medications can hide the warning symptoms of low blood sugar (hypoglycemia). You may need to monitor your blood sugar more closely if youare taking one of these medications. These include: beta-blockers, often used for high blood pressure or heart problems (examples include atenolol, metoprolol, propranolol) clonidine guanethidine reserpine This list may not describe all possible interactions. Give your health care provider a list of all the medicines, herbs, non-prescription drugs, or dietary supplements you use. Also tell them if you smoke, drink alcohol, or use illegaldrugs. Some items may interact with your medicine. What should I watch for while using this medication? Visit your doctor or health care professional for regular checks on yourprogress. Drink plenty of fluids while taking this medicine. Check with your doctor or health care professional if you get an attack of severe diarrhea, nausea, and vomiting. The loss of too much body fluid can make it dangerous for you to takethis medicine. A test called the HbA1C (A1C) will be monitored. This is a simple blood test. It measures your blood sugar control over the last 2 to 3 months. You willreceive this test every 3 to 6 months. Learn how to check your blood sugar. Learn the symptoms of low and high bloodsugar and how to manage them. Always carry a quick-source of sugar with you in case you have symptoms of low blood sugar. Examples include hard sugar candy or glucose tablets. Make sure others know that you can choke if you eat or drink when you develop serious symptoms of low blood sugar, such as seizures or unconsciousness. They must getmedical help at once. Tell your  doctor or health care professional if you have high blood sugar. You might need to change the dose of your medicine. If you are sick or exercisingmore than usual, you might need to change the dose of your medicine. Do not skip meals. Ask your doctor or health care professional if you should avoid alcohol. Many nonprescription cough and cold products contain sugar oralcohol. These can affect blood sugar. Pens should never be shared. Even if the needle is changed, sharing may resultin passing of viruses like hepatitis or HIV. Wear a medical ID bracelet or chain, and carry a card that describes yourdisease and details of your medicine and dosage times. Do not become pregnant while taking this medicine. Women should inform their doctor if they wish to become pregnant or think they might be pregnant. There is a potential for serious side effects to an unborn child. Talk to your healthcare professional or pharmacist for more information. What side effects may I notice from receiving this medication? Side effects that you should report to your doctor or health care professionalas soon as possible: allergic reactions like skin rash, itching or hives, swelling of the face, lips, or tongue breathing problems changes in vision diarrhea that continues or is severe lump or swelling on the neck severe nausea signs and symptoms of infection like fever or chills; cough; sore throat; pain or trouble passing urine signs and symptoms of low blood sugar such as feeling anxious,  confusion, dizziness, increased hunger, unusually weak or tired, sweating, shakiness, cold, irritable, headache, blurred vision, fast heartbeat, loss of consciousness signs and symptoms of kidney injury like trouble passing urine or change in the amount of urine trouble swallowing unusual stomach upset or pain vomiting Side effects that usually do not require medical attention (report to yourdoctor or health care professional if they continue  or are bothersome): constipation diarrhea nausea pain, redness, or irritation at site where injected stomach upset This list may not describe all possible side effects. Call your doctor for medical advice about side effects. You may report side effects to FDA at1-800-FDA-1088. Where should I keep my medication? Keep out of the reach of children. Store unopened pens in a refrigerator between 2 and 8 degrees C (36 and 46 degrees F). Do not freeze. Protect from light and heat. After you first use the pen, it can be stored for 56 days at room temperature between 15 and 30 degrees C (59 and 86 degrees F) or in a refrigerator. Throw away your used pen after 56days or after the expiration date, whichever comes first. Do not store your pen with the needle attached. If the needle is left on,medicine may leak from the pen. NOTE: This sheet is a summary. It may not cover all possible information. If you have questions about this medicine, talk to your doctor, pharmacist, orhealth care provider.  2022 Elsevier/Gold Standard (2019-08-26 09:41:51)

## 2021-06-15 NOTE — Progress Notes (Signed)
Subjective:  Patient ID: Laura Gray, female    DOB: 12-23-1966  Age: 55 y.o. MRN: [id number]  CC: The primary encounter diagnosis was Need for pneumococcal vaccination. A diagnosis of Obesity, diabetes, and hypertension syndrome ([place]) was also pertinent to this visit.  HPI [name] presents for new diagnosis of type 2 DM  This visit occurred during the SARS-CoV-2 public health emergency.  Safety protocols were in place, including screening questions prior to the visit, additional usage of staff PPE, and extensive cleaning of exam room while observing appropriate contact time as indicated for disinfecting solutions.   Since her diagnosis she has had a 5 lb wt loss  in 3 weeks just by restricting soft drinks and energy drinks .  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     telmisartan (MICARDIS) 20 MG tablet TAKE 1 TABLET BY MOUTH AT BEDTIME 90 tablet 1   cetirizine (ZYRTEC) 10 MG tablet Take 10 mg by mouth as needed.     No facility-administered medications prior to visit.    Review of Systems;  Patient denies headache, fevers, malaise, unintentional weight loss, skin rash, eye pain, sinus congestion and sinus pain, sore throat, dysphagia,  hemoptysis , cough, dyspnea, wheezing, chest pain, palpitations, orthopnea, edema, abdominal pain, nausea, melena, diarrhea, constipation, flank pain, dysuria, hematuria, urinary  Frequency, nocturia, numbness, tingling, seizures,  Focal weakness, Loss of consciousness,  Tremor, insomnia, depression, anxiety, and suicidal ideation.      Objective:  BP 124/78 (BP Location: Left Arm, Patient Position: Sitting, Cuff  Size: Large)   Pulse 91   Temp 98.1 F (36.7 C) (Oral)   Ht 5\' 4"  (1.626 m)   Wt 287 lb (130.2 kg)   LMP 07/24/2015   SpO2 98%   BMI 49.26 kg/m   BP Readings from Last 3 Encounters:  06/15/21 124/78  05/25/21 124/86  11/23/20 126/72    Wt Readings from Last 3 Encounters:  06/15/21 287 lb (130.2 kg)  05/25/21 292 lb 6.4 oz (132.6 kg)  11/23/20 290 lb 6.4 oz (131.7 kg)    General appearance: alert, cooperative and appears stated age Ears: normal TM's and external ear canals both ears Throat: lips, mucosa, and tongue normal; teeth and gums normal Neck: no adenopathy, no carotid bruit, supple, symmetrical, trachea midline and thyroid not enlarged, symmetric, no tenderness/mass/nodules Back: symmetric, no curvature. ROM normal. No CVA tenderness. Lungs: clear to auscultation bilaterally Heart: regular rate and rhythm, S1, S2 normal, no murmur, click, rub or gallop Abdomen: soft, non-tender; bowel sounds normal; no masses,  no organomegaly Pulses: 2+ and symmetric Skin: Skin color, texture, turgor normal. No rashes or lesions Lymph nodes: Cervical, supraclavicular, and axillary nodes normal.  Lab Results  Component Value Date   HGBA1C 6.4 05/25/2021   HGBA1C 5.5 02/26/2020   HGBA1C 5.5 08/28/2019    Lab Results  Component Value Date   CREATININE 0.86 05/25/2021   CREATININE 0.87 11/23/2020   CREATININE 0.87 02/26/2020    Lab Results  Component Value Date   WBC 7.3 06/17/2013   HGB 13.6 06/17/2013  HCT 39.1 06/17/2013   PLT 255.0 06/17/2013   GLUCOSE 142 (H) 05/25/2021   CHOL 209 (H) 05/25/2021   TRIG 89.0 05/25/2021   HDL 60.50 05/25/2021   LDLDIRECT 145.6 09/18/2013   LDLCALC 130 (H) 05/25/2021   ALT 24 05/25/2021   AST 15 05/25/2021   NA 138 05/25/2021   K 4.3 05/25/2021   CL 102 05/25/2021   CREATININE 0.86 05/25/2021   BUN 10 05/25/2021   CO2 26 05/25/2021   TSH 1.46 05/25/2021   HGBA1C 6.4 05/25/2021   MICROALBUR 1.4 02/26/2020    No results  found.  Assessment & Plan:   Problem List Items Addressed This Visit       Unprioritized   Obesity, diabetes, and hypertension syndrome ([place])     Discussed her new diagnosis of type 2 DM with fasting glucose of 142,  a1c 6.4  Will recommend initiation of metformin, low glycemic index diet,  regularl exercise  And repeat testing in September .  pneumonia vaaccine given.   Lab Results  Component Value Date   HGBA1C 6.4 05/25/2021   Lab Results  Component Value Date   MICROALBUR 1.4 02/26/2020   MICROALBUR 1.4 01/16/2018    Lab Results  Component Value Date   CHOL 209 (H) 05/25/2021   HDL 60.50 05/25/2021   LDLCALC 130 (H) 05/25/2021   LDLDIRECT 145.6 09/18/2013   TRIG 89.0 05/25/2021   CHOLHDL 3 05/25/2021           Other Visit Diagnoses     Need for pneumococcal vaccination    -  Primary   Relevant Orders   Pneumococcal conjugate vaccine 20-valent (Prevnar 20) (Completed)      A total of 30 minutes was spent with patent more than half of which was spent in counseling patient on the diagnosis of Type 2 DM,  reviewing and explaining recent labs and imaging studies done, and coordination of care. I am having Patty L. Hanners maintain her cetirizine, Melatonin, busPIRone, FLUoxetine, and telmisartan.  No orders of the defined types were placed in this encounter.   There are no discontinued medications.  Follow-up: No follow-ups on file.   Laura Mc, MD

## 2021-06-17 NOTE — Assessment & Plan Note (Addendum)
Discussed her new diagnosis of type 2 DM with fasting glucose of 142,  a1c 6.4  Will recommend initiation of metformin, low glycemic index diet,  regularl exercise  And repeat testing in September .  pneumonia vaaccine given.   Lab Results  Component Value Date   HGBA1C 6.4 05/25/2021   Lab Results  Component Value Date   MICROALBUR 1.4 02/26/2020   MICROALBUR 1.4 01/16/2018    Lab Results  Component Value Date   CHOL 209 (H) 05/25/2021   HDL 60.50 05/25/2021   LDLCALC 130 (H) 05/25/2021   LDLDIRECT 145.6 09/18/2013   TRIG 89.0 05/25/2021   CHOLHDL 3 05/25/2021

## 2021-08-05 ENCOUNTER — Other Ambulatory Visit: Payer: Self-pay

## 2021-08-05 ENCOUNTER — Encounter: Payer: Self-pay | Admitting: Internal Medicine

## 2021-09-01 DIAGNOSIS — H5213 Myopia, bilateral: Secondary | ICD-10-CM | POA: Diagnosis not present

## 2021-09-01 LAB — HM DIABETES EYE EXAM

## 2021-09-07 ENCOUNTER — Other Ambulatory Visit: Payer: Self-pay

## 2021-09-07 MED FILL — Buspirone HCl Tab 30 MG: ORAL | 90 days supply | Qty: 180 | Fill #1 | Status: AC

## 2021-11-22 ENCOUNTER — Other Ambulatory Visit: Payer: Self-pay

## 2021-11-29 ENCOUNTER — Other Ambulatory Visit: Payer: Self-pay

## 2021-11-29 ENCOUNTER — Ambulatory Visit: Payer: 59 | Admitting: Internal Medicine

## 2021-11-29 ENCOUNTER — Encounter: Payer: Self-pay | Admitting: Internal Medicine

## 2021-11-29 VITALS — BP 110/68 | HR 101 | Temp 96.3°F | Ht 64.0 in | Wt 281.8 lb

## 2021-11-29 DIAGNOSIS — E669 Obesity, unspecified: Secondary | ICD-10-CM

## 2021-11-29 DIAGNOSIS — E1169 Type 2 diabetes mellitus with other specified complication: Secondary | ICD-10-CM | POA: Diagnosis not present

## 2021-11-29 DIAGNOSIS — E782 Mixed hyperlipidemia: Secondary | ICD-10-CM | POA: Diagnosis not present

## 2021-11-29 DIAGNOSIS — E1159 Type 2 diabetes mellitus with other circulatory complications: Secondary | ICD-10-CM | POA: Diagnosis not present

## 2021-11-29 DIAGNOSIS — I152 Hypertension secondary to endocrine disorders: Secondary | ICD-10-CM | POA: Diagnosis not present

## 2021-11-29 DIAGNOSIS — F411 Generalized anxiety disorder: Secondary | ICD-10-CM | POA: Diagnosis not present

## 2021-11-29 LAB — MICROALBUMIN / CREATININE URINE RATIO
Creatinine,U: 265.2 mg/dL
Microalb Creat Ratio: 0.6 mg/g (ref 0.0–30.0)
Microalb, Ur: 1.7 mg/dL (ref 0.0–1.9)

## 2021-11-29 MED ORDER — BUSPIRONE HCL 30 MG PO TABS
ORAL_TABLET | Freq: Two times a day (BID) | ORAL | 3 refills | Status: DC
  Filled 2021-11-29 – 2022-01-31 (×2): qty 180, 90d supply, fill #0
  Filled 2022-06-19: qty 180, 90d supply, fill #1
  Filled 2022-10-25: qty 180, 90d supply, fill #2

## 2021-11-29 NOTE — Patient Instructions (Signed)
I congratulate you on losing weight!  If you hit a plateau,  or start gaining,  consider Ozempic or Mounjaro

## 2021-11-29 NOTE — Progress Notes (Signed)
Subjective:  Patient ID: Laura Gray, female    DOB: 07-18-1966  Age: 55 y.o. MRN: [id number]  CC: The primary encounter diagnosis was Obesity, diabetes, and hypertension syndrome ([place]). Diagnoses of Generalized anxiety disorder and Moderate mixed hyperlipidemia not requiring statin therapy were also pertinent to this visit.  HPI Laura Gray presents for  Chief Complaint  Patient presents with   Follow-up    6 month follow up on diabetes, hypertension and hyperlipidemia   Daughter [name] has moved out with a friend with borderline personality in [place] Losing weight with lifestyle modification      Outpatient Medications Prior to Visit  Medication Sig Dispense Refill   cetirizine (ZYRTEC) 10 MG tablet Take 10 mg by mouth as needed.     FLUoxetine (PROZAC) 40 MG capsule TAKE 1 CAPSULE BY MOUTH DAILY 90 capsule 1   Melatonin 1 MG CAPS Take 1 capsule by mouth at bedtime as needed.     telmisartan (MICARDIS) 20 MG tablet TAKE 1 TABLET BY MOUTH AT BEDTIME 90 tablet 1   busPIRone (BUSPAR) 30 MG tablet TAKE 1 TABLET BY MOUTH TWO TIMES DAILY 180 tablet 3   No facility-administered medications prior to visit.    Review of Systems;  Patient denies headache, fevers, malaise, unintentional weight loss, skin rash, eye pain, sinus congestion and sinus pain, sore throat, dysphagia,  hemoptysis , cough, dyspnea, wheezing, chest pain, palpitations, orthopnea, edema, abdominal pain, nausea, melena, diarrhea, constipation, flank pain, dysuria, hematuria, urinary  Frequency, nocturia, numbness, tingling, seizures,  Focal weakness, Loss of consciousness,  Tremor, insomnia, depression, anxiety, and suicidal ideation.      Objective:  BP 110/68 (BP Location: Left Arm, Patient Position: Sitting, Cuff Size: Large)   Pulse (!) 101   Temp (!) 96.3 F (35.7 C) (Temporal)   Ht _0  (1.626 m)   Wt 281 lb 12.8 oz (127.8 kg)   LMP 07/24/2015   SpO2 97%   BMI 48.37 kg/m   BP Readings from Last 3  Encounters:  11/29/21 110/68  06/15/21 124/78  05/25/21 124/86    Wt Readings from Last 3 Encounters:  11/29/21 281 lb 12.8 oz (127.8 kg)  06/15/21 287 lb (130.2 kg)  05/25/21 292 lb 6.4 oz (132.6 kg)    General appearance: alert, cooperative and appears stated age Ears: normal TM's and external ear canals both ears Throat: lips, mucosa, and tongue normal; teeth and gums normal Neck: no adenopathy, no carotid bruit, supple, symmetrical, trachea midline and thyroid not enlarged, symmetric, no tenderness/mass/nodules Back: symmetric, no curvature. ROM normal. No CVA tenderness. Lungs: clear to auscultation bilaterally Heart: regular rate and rhythm, S1, S2 normal, no murmur, click, rub or gallop Abdomen: soft, non-tender; bowel sounds normal; no masses,  no organomegaly Pulses: 2+ and symmetric Skin: Skin color, texture, turgor normal. No rashes or lesions Lymph nodes: Cervical, supraclavicular, and axillary nodes normal.  Lab Results  Component Value Date   HGBA1C 6.4 05/25/2021   HGBA1C 5.5 02/26/2020   HGBA1C 5.5 08/28/2019    Lab Results  Component Value Date   CREATININE 0.86 05/25/2021   CREATININE 0.87 11/23/2020   CREATININE 0.87 02/26/2020    Lab Results  Component Value Date   WBC 7.3 06/17/2013   HGB 13.6 06/17/2013   HCT 39.1 06/17/2013   PLT 255.0 06/17/2013   GLUCOSE 142 (H) 05/25/2021   CHOL 209 (H) 05/25/2021   TRIG 89.0 05/25/2021   HDL 60.50 05/25/2021   LDLDIRECT 145.6 09/18/2013  LDLCALC 130 (H) 05/25/2021   ALT 24 05/25/2021   AST 15 05/25/2021   NA 138 05/25/2021   K 4.3 05/25/2021   CL 102 05/25/2021   CREATININE 0.86 05/25/2021   BUN 10 05/25/2021   CO2 26 05/25/2021   TSH 1.46 05/25/2021   HGBA1C 6.4 05/25/2021   MICROALBUR 1.4 02/26/2020    No results found.  Assessment & Plan:   Problem List Items Addressed This Visit     Generalized anxiety disorder    Managed well with regimen of prozac 40 mg  and buspar 30 mg bid . No  changes today       Relevant Medications   busPIRone (BUSPAR) 30 MG tablet   Moderate mixed hyperlipidemia not requiring statin therapy    Will recommend statin therapy following review of today's labs      Obesity, diabetes, and hypertension syndrome ([place]) - Primary    She is losing weight gradually with dietary modifications.  I have congratulated her in reduction of   BMI and encouraged  Continued weight loss with goal of 10% of body weigh over the next 6 months using a low glycemic index diet and regular exercise a minimum of 5 days per week.        Relevant Orders   HgB A1c   Lipid Profile   Comp Met (CMET)   Urine Microalbumin w/creat. ratio    I am having Liley L. Gali maintain her cetirizine, Melatonin, FLUoxetine, telmisartan, and busPIRone.  Meds ordered this encounter  Medications   busPIRone (BUSPAR) 30 MG tablet    Sig: TAKE 1 TABLET BY MOUTH TWO TIMES DAILY    Dispense:  180 tablet    Refill:  3     I provided  30 minutes of  face-to-face time during this encounter reviewing patient's current problems and past surgeries, labs and imaging studies, providing counseling on the above mentioned problems , and coordination  of care .   Follow-up: No follow-ups on file.   Crecencio Mc, MD

## 2021-11-29 NOTE — Assessment & Plan Note (Signed)
Will recommend statin therapy following review of today's labs

## 2021-11-29 NOTE — Assessment & Plan Note (Signed)
Managed well with regimen of prozac 40 mg  and buspar 30 mg bid . No changes today

## 2021-11-29 NOTE — Assessment & Plan Note (Signed)
She is losing weight gradually with dietary modifications.  I have congratulated her in reduction of   BMI and encouraged  Continued weight loss with goal of 10% of body weigh over the next 6 months using a low glycemic index diet and regular exercise a minimum of 5 days per week.

## 2021-11-30 ENCOUNTER — Other Ambulatory Visit: Payer: Self-pay

## 2021-11-30 LAB — LIPID PANEL
Cholesterol: 203 mg/dL — ABNORMAL HIGH (ref 0–200)
HDL: 62.8 mg/dL (ref >39.00)
LDL Cholesterol: 117 mg/dL — ABNORMAL HIGH (ref 0–99)
NonHDL: 139.76
Total CHOL/HDL Ratio: 3
Triglycerides: 113 mg/dL (ref 0.0–149.0)
VLDL: 22.6 mg/dL (ref 0.0–40.0)

## 2021-11-30 LAB — COMPREHENSIVE METABOLIC PANEL
ALT: 25 U/L (ref 0–35)
AST: 18 U/L (ref 0–37)
Albumin: 3.9 g/dL (ref 3.5–5.2)
Alkaline Phosphatase: 71 U/L (ref 39–117)
BUN: 12 mg/dL (ref 6–23)
CO2: 28 mEq/L (ref 19–32)
Calcium: 9.3 mg/dL (ref 8.4–10.5)
Chloride: 102 mEq/L (ref 96–112)
Creatinine, Ser: 0.85 mg/dL (ref 0.40–1.20)
GFR: 77.07 mL/min (ref >60.00)
Glucose, Bld: 137 mg/dL — ABNORMAL HIGH (ref 70–99)
Potassium: 4.1 mEq/L (ref 3.5–5.1)
Sodium: 137 mEq/L (ref 135–145)
Total Bilirubin: 0.9 mg/dL (ref 0.2–1.2)
Total Protein: 6.8 g/dL (ref 6.0–8.3)

## 2021-11-30 LAB — HEMOGLOBIN A1C: Hgb A1c MFr Bld: 5.7 % (ref 4.6–6.5)

## 2021-12-16 ENCOUNTER — Other Ambulatory Visit: Payer: Self-pay

## 2022-01-31 ENCOUNTER — Other Ambulatory Visit: Payer: Self-pay

## 2022-04-06 ENCOUNTER — Other Ambulatory Visit: Payer: Self-pay | Admitting: Internal Medicine

## 2022-04-06 ENCOUNTER — Other Ambulatory Visit: Payer: Self-pay

## 2022-04-07 ENCOUNTER — Other Ambulatory Visit: Payer: Self-pay

## 2022-04-07 MED ORDER — FLUOXETINE HCL 40 MG PO CAPS
ORAL_CAPSULE | Freq: Every day | ORAL | 1 refills | Status: DC
  Filled 2022-04-07: qty 90, 90d supply, fill #0
  Filled 2022-07-13: qty 90, 90d supply, fill #1

## 2022-04-07 MED ORDER — TELMISARTAN 20 MG PO TABS
ORAL_TABLET | Freq: Every day | ORAL | 1 refills | Status: DC
  Filled 2022-04-07: qty 90, 90d supply, fill #0
  Filled 2022-07-13: qty 90, 90d supply, fill #1

## 2022-05-29 ENCOUNTER — Encounter: Payer: Self-pay | Admitting: Internal Medicine

## 2022-05-30 ENCOUNTER — Other Ambulatory Visit: Payer: Self-pay

## 2022-05-30 ENCOUNTER — Ambulatory Visit: Payer: 59 | Admitting: Internal Medicine

## 2022-05-30 ENCOUNTER — Encounter: Payer: Self-pay | Admitting: Internal Medicine

## 2022-05-30 VITALS — BP 114/72 | HR 85 | Temp 97.9°F | Ht 64.0 in | Wt 282.8 lb

## 2022-05-30 DIAGNOSIS — Z9189 Other specified personal risk factors, not elsewhere classified: Secondary | ICD-10-CM

## 2022-05-30 DIAGNOSIS — E1159 Type 2 diabetes mellitus with other circulatory complications: Secondary | ICD-10-CM | POA: Diagnosis not present

## 2022-05-30 DIAGNOSIS — I10 Essential (primary) hypertension: Secondary | ICD-10-CM | POA: Diagnosis not present

## 2022-05-30 DIAGNOSIS — E782 Mixed hyperlipidemia: Secondary | ICD-10-CM | POA: Diagnosis not present

## 2022-05-30 DIAGNOSIS — E669 Obesity, unspecified: Secondary | ICD-10-CM

## 2022-05-30 DIAGNOSIS — E1169 Type 2 diabetes mellitus with other specified complication: Secondary | ICD-10-CM | POA: Diagnosis not present

## 2022-05-30 DIAGNOSIS — I152 Hypertension secondary to endocrine disorders: Secondary | ICD-10-CM | POA: Diagnosis not present

## 2022-05-30 DIAGNOSIS — Z1231 Encounter for screening mammogram for malignant neoplasm of breast: Secondary | ICD-10-CM | POA: Diagnosis not present

## 2022-05-30 LAB — COMPREHENSIVE METABOLIC PANEL
ALT: 24 U/L (ref 0–35)
AST: 18 U/L (ref 0–37)
Albumin: 3.9 g/dL (ref 3.5–5.2)
Alkaline Phosphatase: 68 U/L (ref 39–117)
BUN: 15 mg/dL (ref 6–23)
CO2: 26 mEq/L (ref 19–32)
Calcium: 9.1 mg/dL (ref 8.4–10.5)
Chloride: 102 mEq/L (ref 96–112)
Creatinine, Ser: 0.8 mg/dL (ref 0.40–1.20)
GFR: 82.6 mL/min (ref >60.00)
Glucose, Bld: 134 mg/dL — ABNORMAL HIGH (ref 70–99)
Potassium: 4.2 mEq/L (ref 3.5–5.1)
Sodium: 136 mEq/L (ref 135–145)
Total Bilirubin: 0.9 mg/dL (ref 0.2–1.2)
Total Protein: 7 g/dL (ref 6.0–8.3)

## 2022-05-30 LAB — LIPID PANEL
Cholesterol: 193 mg/dL (ref 0–200)
HDL: 58.6 mg/dL (ref >39.00)
LDL Cholesterol: 113 mg/dL — ABNORMAL HIGH (ref 0–99)
NonHDL: 134.77
Total CHOL/HDL Ratio: 3
Triglycerides: 110 mg/dL (ref 0.0–149.0)
VLDL: 22 mg/dL (ref 0.0–40.0)

## 2022-05-30 LAB — HEMOGLOBIN A1C: Hgb A1c MFr Bld: 6.1 % (ref 4.6–6.5)

## 2022-05-30 LAB — LDL CHOLESTEROL, DIRECT: Direct LDL: 119 mg/dL

## 2022-05-30 MED ORDER — ROSUVASTATIN CALCIUM 5 MG PO TABS
5.0000 mg | ORAL_TABLET | Freq: Every day | ORAL | 1 refills | Status: DC
  Filled 2022-05-30: qty 90, 90d supply, fill #0
  Filled 2022-09-05: qty 90, 90d supply, fill #1

## 2022-05-30 MED ORDER — METFORMIN HCL ER 500 MG PO TB24
500.0000 mg | ORAL_TABLET | Freq: Every day | ORAL | 1 refills | Status: DC
  Filled 2022-05-30: qty 90, 90d supply, fill #0
  Filled 2022-09-05: qty 90, 90d supply, fill #1

## 2022-05-30 NOTE — Assessment & Plan Note (Signed)
Her PAP smear will be done in December at her CPE

## 2022-05-30 NOTE — Progress Notes (Signed)
Subjective:  Patient ID: Laura Gray, female    DOB: 1966-02-16  Age: 56 y.o. MRN: [id number]  CC: The primary encounter diagnosis was Obesity, diabetes, and hypertension syndrome ([place]). Diagnoses of Moderate mixed hyperlipidemia not requiring statin therapy, Encounter for screening mammogram for malignant neoplasm of breast, DES exposure in utero, and Primary hypertension were also pertinent to this visit.   HPI LANETTA FIGUERO presents for follow up on diabetes , obesity and HTN Chief Complaint  Patient presents with   Follow-up    6 month follow up on hypertension and diabetes   1) T2DM:    She  feels generally well,  But is not  exercising regularly and has not lost any more  weight. Checking  blood sugars less than once daily at variable times, usually only if she feels she may be having a hypoglycemic event. .  BS have been under 130 fasting and < 150 post prandially.  Denies any recent hypoglyemic events.  Taking  telmisartan only. . Following a carbohydrate modified diet 6 days per week. Denies numbness, burning and tingling of extremities. Appetite is good. She has had no prior trial of statin  2) HTN:  Patient is taking telmisartan  as prescribed and notes no adverse effects.  Home BP readings have been done about once per week and are  generally < 130/80 .  She is avoiding added salt in her diet   Outpatient Medications Prior to Visit  Medication Sig Dispense Refill   busPIRone (BUSPAR) 30 MG tablet TAKE 1 TABLET BY MOUTH TWO TIMES DAILY 180 tablet 3   cetirizine (ZYRTEC) 10 MG tablet Take 10 mg by mouth as needed.     FLUoxetine (PROZAC) 40 MG capsule TAKE 1 CAPSULE BY MOUTH DAILY 90 capsule 1   Melatonin 1 MG CAPS Take 1 capsule by mouth at bedtime as needed.     telmisartan (MICARDIS) 20 MG tablet TAKE 1 TABLET BY MOUTH AT BEDTIME 90 tablet 1   No facility-administered medications prior to visit.    Review of Systems;  Patient denies headache, fevers, malaise,  unintentional weight loss, skin rash, eye pain, sinus congestion and sinus pain, sore throat, dysphagia,  hemoptysis , cough, dyspnea, wheezing, chest pain, palpitations, orthopnea, edema, abdominal pain, nausea, melena, diarrhea, constipation, flank pain, dysuria, hematuria, urinary  Frequency, nocturia, numbness, tingling, seizures,  Focal weakness, Loss of consciousness,  Tremor, insomnia, depression, anxiety, and suicidal ideation.      Objective:  BP 114/72 (BP Location: Left Arm, Patient Position: Sitting) Comment (Cuff Size): thigh cuff  Pulse 85   Temp 97.9 F (36.6 C) (Oral)   Ht 5' 4"  (1.626 m)   Wt 282 lb 12.8 oz (128.3 kg)   LMP 07/24/2015   SpO2 97%   BMI 48.54 kg/m   BP Readings from Last 3 Encounters:  05/30/22 114/72  11/29/21 110/68  06/15/21 124/78    Wt Readings from Last 3 Encounters:  05/30/22 282 lb 12.8 oz (128.3 kg)  11/29/21 281 lb 12.8 oz (127.8 kg)  06/15/21 287 lb (130.2 kg)    General appearance: alert, cooperative and appears stated age Ears: normal TM's and external ear canals both ears Throat: lips, mucosa, and tongue normal; teeth and gums normal Neck: no adenopathy, no carotid bruit, supple, symmetrical, trachea midline and thyroid not enlarged, symmetric, no tenderness/mass/nodules Back: symmetric, no curvature. ROM normal. No CVA tenderness. Lungs: clear to auscultation bilaterally Heart: regular rate and rhythm, S1, S2 normal, no murmur, click,  rub or gallop Abdomen: soft, non-tender; bowel sounds normal; no masses,  no organomegaly Pulses: 2+ and symmetric Skin: Skin color, texture, turgor normal. No rashes or lesions Lymph nodes: Cervical, supraclavicular, and axillary nodes normal.  Lab Results  Component Value Date   HGBA1C 6.1 05/30/2022   HGBA1C 5.7 11/29/2021   HGBA1C 6.4 05/25/2021    Lab Results  Component Value Date   CREATININE 0.80 05/30/2022   CREATININE 0.85 11/29/2021   CREATININE 0.86 05/25/2021    Lab Results   Component Value Date   WBC 7.3 06/17/2013   HGB 13.6 06/17/2013   HCT 39.1 06/17/2013   PLT 255.0 06/17/2013   GLUCOSE 134 (H) 05/30/2022   CHOL 193 05/30/2022   TRIG 110.0 05/30/2022   HDL 58.60 05/30/2022   LDLDIRECT 119.0 05/30/2022   LDLCALC 113 (H) 05/30/2022   ALT 24 05/30/2022   AST 18 05/30/2022   NA 136 05/30/2022   K 4.2 05/30/2022   CL 102 05/30/2022   CREATININE 0.80 05/30/2022   BUN 15 05/30/2022   CO2 26 05/30/2022   TSH 1.46 05/25/2021   HGBA1C 6.1 05/30/2022   MICROALBUR 1.7 11/29/2021    No results found.  Assessment & Plan:   Problem List Items Addressed This Visit     DES exposure in utero    Her PAP smear will be done in December at her CPE       Hypertension    Well controlled on current regimen of telmisartan 20 mg daily . Renal function stable, no changes today.       Relevant Medications   rosuvastatin (CRESTOR) 5 MG tablet   Moderate mixed hyperlipidemia not requiring statin therapy   Relevant Medications   rosuvastatin (CRESTOR) 5 MG tablet   Other Relevant Orders   Lipid Profile (Completed)   Direct LDL (Completed)   Obesity, diabetes, and hypertension syndrome ([place]) - Primary    Her weight loss has stalled .  Encouraged trial of metformin with  goal of 10% of body weight over the next 6 months using a low glycemic index diet and regular exercise a minimum of 5 days per week.   GLP 1 agonists discussed,  But her maternal GM had thyroid CA of uncertain  Type.  Adding crestor.  continue telmisartan  Lab Results  Component Value Date   HGBA1C 6.1 05/30/2022   Lab Results  Component Value Date   MICROALBUR 1.7 11/29/2021   MICROALBUR 1.4 02/26/2020          Relevant Medications   metFORMIN (GLUCOPHAGE-XR) 500 MG 24 hr tablet   rosuvastatin (CRESTOR) 5 MG tablet   Other Relevant Orders   Hemoglobin A1c (Completed)   Comp Met (CMET) (Completed)   Screening for breast cancer   Relevant Orders   MM 3D SCREEN BREAST BILATERAL     I spent a total of 30 minutes with this patient in a face to face visit on the date of this encounter reviewing the last office visit with me,  patient's diet and eating habits, home blood pressure readings,  most recent imaging study , and post visit ordering of testing and therapeutics.    Follow-up: Return in about 6 months (around 11/29/2022).   Crecencio Mc, MD

## 2022-05-30 NOTE — Assessment & Plan Note (Signed)
Well controlled on current regimen of telmisartan 20 mg daily . Renal function stable, no changes today.

## 2022-05-30 NOTE — Patient Instructions (Addendum)
1) Find out what kind of thyroid Ca Grandma had (if you can)  2) Trial of metformin to help with weight loss.  Start with 1 tablet daily,  increase to 2 after 2 weeks if tolerated without nausea and/or diarrhea  3) crestor (generic) 5 mg daily indicated bc you have diabetes   4) repeat FLTS after 3-4 weeks of crestor.   See you in 6 months ! We'll do your PAP smear then

## 2022-05-30 NOTE — Assessment & Plan Note (Addendum)
Her weight loss has stalled .  Encouraged trial of metformin with  goal of 10% of body weight over the next 6 months using a low glycemic index diet and regular exercise a minimum of 5 days per week.   GLP 1 agonists discussed,  But her maternal GM had thyroid CA of uncertain  Type.  Adding crestor.  continue telmisartan  Lab Results  Component Value Date   HGBA1C 6.1 05/30/2022   Lab Results  Component Value Date   MICROALBUR 1.7 11/29/2021   MICROALBUR 1.4 02/26/2020

## 2022-05-31 ENCOUNTER — Other Ambulatory Visit: Payer: Self-pay

## 2022-05-31 MED ORDER — ATORVASTATIN CALCIUM 20 MG PO TABS
20.0000 mg | ORAL_TABLET | Freq: Every day | ORAL | 3 refills | Status: DC
  Filled 2022-05-31: qty 90, 90d supply, fill #0

## 2022-05-31 NOTE — Addendum Note (Signed)
Addended by: Crecencio Mc on: 05/31/2022 01:01 PM   Modules accepted: Orders

## 2022-06-05 ENCOUNTER — Telehealth: Payer: Self-pay | Admitting: Internal Medicine

## 2022-06-05 DIAGNOSIS — E785 Hyperlipidemia, unspecified: Secondary | ICD-10-CM

## 2022-06-05 NOTE — Addendum Note (Signed)
Addended by: Adair Laundry on: 06/05/2022 03:35 PM   Modules accepted: Orders

## 2022-06-05 NOTE — Telephone Encounter (Signed)
Spoke with pt and scheduled her for a lab appt in 3 weeks to check LFTs due to start of new medication. Order has been placed.

## 2022-06-05 NOTE — Telephone Encounter (Signed)
MyChart message:  Appointment Request From: [name]   With Provider: Crecencio Mc, MD [facility]]   Preferred Date Range: 06/21/2022 - 06/29/2022   Preferred Times: Monday Morning, Tuesday Afternoon, Wednesday Afternoon, Thursday Afternoon   Reason for visit: Request an Appointment   Comments: Need to schedule LFT labs due to new medication

## 2022-06-19 ENCOUNTER — Other Ambulatory Visit: Payer: Self-pay

## 2022-06-20 ENCOUNTER — Other Ambulatory Visit (INDEPENDENT_AMBULATORY_CARE_PROVIDER_SITE_OTHER): Payer: 59

## 2022-06-20 DIAGNOSIS — E785 Hyperlipidemia, unspecified: Secondary | ICD-10-CM

## 2022-06-20 LAB — COMPREHENSIVE METABOLIC PANEL
ALT: 23 U/L (ref 0–35)
AST: 17 U/L (ref 0–37)
Albumin: 4 g/dL (ref 3.5–5.2)
Alkaline Phosphatase: 73 U/L (ref 39–117)
BUN: 13 mg/dL (ref 6–23)
CO2: 23 mEq/L (ref 19–32)
Calcium: 9.2 mg/dL (ref 8.4–10.5)
Chloride: 102 mEq/L (ref 96–112)
Creatinine, Ser: 0.87 mg/dL (ref 0.40–1.20)
GFR: 74.66 mL/min (ref >60.00)
Glucose, Bld: 192 mg/dL — ABNORMAL HIGH (ref 70–99)
Potassium: 3.6 mEq/L (ref 3.5–5.1)
Sodium: 134 mEq/L — ABNORMAL LOW (ref 135–145)
Total Bilirubin: 0.6 mg/dL (ref 0.2–1.2)
Total Protein: 7.3 g/dL (ref 6.0–8.3)

## 2022-07-13 ENCOUNTER — Other Ambulatory Visit: Payer: Self-pay

## 2022-07-14 ENCOUNTER — Ambulatory Visit
Admission: RE | Admit: 2022-07-14 | Discharge: 2022-07-14 | Disposition: A | Discharge status: Home / Self Care | Payer: 59 | Source: Ambulatory Visit | Attending: Internal Medicine | Admitting: Internal Medicine

## 2022-07-14 DIAGNOSIS — Z1231 Encounter for screening mammogram for malignant neoplasm of breast: Secondary | ICD-10-CM | POA: Insufficient documentation

## 2022-09-05 ENCOUNTER — Other Ambulatory Visit: Payer: Self-pay

## 2022-10-25 ENCOUNTER — Other Ambulatory Visit: Payer: Self-pay | Admitting: Internal Medicine

## 2022-10-25 ENCOUNTER — Other Ambulatory Visit: Payer: Self-pay

## 2022-10-26 ENCOUNTER — Other Ambulatory Visit: Payer: Self-pay

## 2022-10-26 MED ORDER — FLUOXETINE HCL 40 MG PO CAPS
ORAL_CAPSULE | Freq: Every day | ORAL | 1 refills | Status: DC
  Filled 2022-10-26: qty 90, 90d supply, fill #0
  Filled 2023-02-13: qty 90, 90d supply, fill #1

## 2022-10-26 MED ORDER — TELMISARTAN 20 MG PO TABS
ORAL_TABLET | Freq: Every day | ORAL | 1 refills | Status: DC
Start: 2022-10-26 — End: 2023-05-22
  Filled 2022-10-26: qty 90, 90d supply, fill #0
  Filled 2023-02-13: qty 90, 90d supply, fill #1

## 2022-12-06 ENCOUNTER — Ambulatory Visit (INDEPENDENT_AMBULATORY_CARE_PROVIDER_SITE_OTHER): Payer: 59 | Admitting: Internal Medicine

## 2022-12-06 ENCOUNTER — Encounter: Payer: Self-pay | Admitting: Internal Medicine

## 2022-12-06 ENCOUNTER — Other Ambulatory Visit: Payer: Self-pay

## 2022-12-06 ENCOUNTER — Other Ambulatory Visit (HOSPITAL_COMMUNITY)
Admission: RE | Admit: 2022-12-06 | Discharge: 2022-12-06 | Disposition: A | Discharge status: Home / Self Care | Payer: 59 | Source: Ambulatory Visit | Attending: Internal Medicine | Admitting: Internal Medicine

## 2022-12-06 VITALS — BP 118/82 | HR 84 | Temp 98.8°F | Ht 64.0 in | Wt 288.7 lb

## 2022-12-06 DIAGNOSIS — Z9189 Other specified personal risk factors, not elsewhere classified: Secondary | ICD-10-CM | POA: Diagnosis not present

## 2022-12-06 DIAGNOSIS — Z124 Encounter for screening for malignant neoplasm of cervix: Secondary | ICD-10-CM | POA: Diagnosis not present

## 2022-12-06 DIAGNOSIS — D126 Benign neoplasm of colon, unspecified: Secondary | ICD-10-CM | POA: Diagnosis not present

## 2022-12-06 DIAGNOSIS — R5383 Other fatigue: Secondary | ICD-10-CM

## 2022-12-06 DIAGNOSIS — I152 Hypertension secondary to endocrine disorders: Secondary | ICD-10-CM | POA: Diagnosis not present

## 2022-12-06 DIAGNOSIS — R635 Abnormal weight gain: Secondary | ICD-10-CM | POA: Diagnosis not present

## 2022-12-06 DIAGNOSIS — E1169 Type 2 diabetes mellitus with other specified complication: Secondary | ICD-10-CM

## 2022-12-06 DIAGNOSIS — E669 Obesity, unspecified: Secondary | ICD-10-CM | POA: Diagnosis not present

## 2022-12-06 DIAGNOSIS — E1159 Type 2 diabetes mellitus with other circulatory complications: Secondary | ICD-10-CM

## 2022-12-06 MED ORDER — SEMAGLUTIDE (1 MG/DOSE) 4 MG/3ML ~~LOC~~ SOPN
1.0000 mg | PEN_INJECTOR | SUBCUTANEOUS | 1 refills | Status: DC
  Filled 2022-12-06 – 2023-03-26 (×3): qty 3, 28d supply, fill #0
  Filled 2023-04-30: qty 3, 28d supply, fill #1

## 2022-12-06 MED ORDER — SEMAGLUTIDE(0.25 OR 0.5MG/DOS) 2 MG/3ML ~~LOC~~ SOPN
0.2500 mg | PEN_INJECTOR | SUBCUTANEOUS | 1 refills | Status: DC
  Filled 2022-12-06: qty 3, 30d supply, fill #0
  Filled 2022-12-06: qty 3, 28d supply, fill #0
  Filled 2023-02-06: qty 3, 28d supply, fill #1

## 2022-12-06 NOTE — Progress Notes (Signed)
Patient ID: Laura Gray, female    DOB: 12/28/1965  Age: 56 y.o. MRN: [id number]  The patient is here for annual preventive examination and management of other chronic and acute problems.   The risk factors are reflected in the social history.   The roster of all physicians providing medical care to patient - is listed in the Snapshot section of the chart.   Activities of daily living:  The patient is 100% independent in all ADLs: dressing, toileting, feeding as well as independent mobility   Home safety : The patient has smoke detectors in the home. They wear seatbelts.  There are no unsecured firearms at home. There is no violence in the home.    There is no risks for hepatitis, STDs or HIV. There is no   history of blood transfusion. They have no travel history to infectious disease endemic areas of the world.   The patient has seen their dentist in the last six month. They have seen their eye doctor in the last year. The patinet  denies slight hearing difficulty with regard to whispered voices and some television programs.  They have deferred audiologic testing in the last year.  They do not  have excessive sun exposure. Discussed the need for sun protection: hats, long sleeves and use of sunscreen if there is significant sun exposure.    Diet: the importance of a healthy diet is discussed. She has an inconsistent diet,  and is not losing weight .   The benefits of regular aerobic exercise were discussed. The patient  does not exercise regularly .    Depression screen: there are no signs or vegative symptoms of depression- irritability, change in appetite, anhedonia, sadness/tearfullness.   The following portions of the patient's history were reviewed and updated as appropriate: allergies, current medications, past family history, past medical history,  past surgical history, past social history  and problem list.   Visual acuity was not assessed per patient preference since the patient  has regular follow up with an  ophthalmologist. Hearing and body mass index were assessed and reviewed.    During the course of the visit the patient was educated and counseled about appropriate screening and preventive services including : fall prevention , diabetes screening, nutrition counseling, colorectal cancer screening, and recommended immunizations.    Chief Complaint:  1) Follow up on type 2 DM, obesity and HTN .  Weight gain has occurred.  Patient has been unable to lose or maintain a healthy weight despite good effort.     Review of Symptoms  Patient denies headache, fevers, malaise, unintentional weight loss, skin rash, eye pain, sinus congestion and sinus pain, sore throat, dysphagia,  hemoptysis , cough, dyspnea, wheezing, chest pain, palpitations, orthopnea, edema, abdominal pain, nausea, melena, diarrhea, constipation, flank pain, dysuria, hematuria, urinary  Frequency, nocturia, numbness, tingling, seizures,  Focal weakness, Loss of consciousness,  Tremor, insomnia, depression, anxiety, and suicidal ideation.    Physical Exam:  BP 118/82   Pulse 84   Temp 98.8 F (37.1 C) (Oral)   Ht '5\' 4"'$  (1.626 m)   Wt 288 lb 11.2 oz (131 kg)   LMP 07/24/2015   SpO2 97%   BMI 49.56 kg/m    Physical Exam Vitals reviewed.  Constitutional:      General: She is not in acute distress.    Appearance: Normal appearance. She is well-developed and normal weight. She is not ill-appearing, toxic-appearing or diaphoretic.  HENT:     Head: Normocephalic.  Right Ear: Tympanic membrane, ear canal and external ear normal. There is no impacted cerumen.     Left Ear: Tympanic membrane, ear canal and external ear normal. There is no impacted cerumen.     Nose: Nose normal.     Mouth/Throat:     Mouth: Mucous membranes are moist.     Pharynx: Oropharynx is clear.  Eyes:     General: No scleral icterus.       Right eye: No discharge.        Left eye: No discharge.     Conjunctiva/sclera:  Conjunctivae normal.     Pupils: Pupils are equal, round, and reactive to light.  Neck:     Thyroid: No thyromegaly.     Vascular: No carotid bruit or JVD.  Cardiovascular:     Rate and Rhythm: Normal rate and regular rhythm.     Heart sounds: Normal heart sounds.  Pulmonary:     Effort: Pulmonary effort is normal. No respiratory distress.     Breath sounds: Normal breath sounds.  Chest:  Breasts:    Breasts are symmetrical.     Right: Normal. No swelling, inverted nipple, mass, nipple discharge, skin change or tenderness.     Left: Normal. No swelling, inverted nipple, mass, nipple discharge, skin change or tenderness.  Abdominal:     General: Bowel sounds are normal.     Palpations: Abdomen is soft. There is no mass.     Tenderness: There is no abdominal tenderness. There is no guarding or rebound.     Hernia: There is no hernia in the left inguinal area or right inguinal area.  Genitourinary:    Exam position: Lithotomy position.     Pubic Area: No rash or pubic lice.      Labia:        Right: No rash, tenderness, lesion or injury.        Left: No rash, tenderness, lesion or injury.      Vagina: Normal.     Cervix: Normal.     Uterus: Normal.      Adnexa: Right adnexa normal and left adnexa normal.  Musculoskeletal:        General: Normal range of motion.     Cervical back: Normal range of motion and neck supple.  Lymphadenopathy:     Cervical: No cervical adenopathy.     Upper Body:     Right upper body: No supraclavicular, axillary or pectoral adenopathy.     Left upper body: No supraclavicular, axillary or pectoral adenopathy.     Lower Body: No right inguinal adenopathy. No left inguinal adenopathy.  Skin:    General: Skin is warm and dry.  Neurological:     General: No focal deficit present.     Mental Status: She is alert and oriented to person, place, and time. Mental status is at baseline.  Psychiatric:        Mood and Affect: Mood normal.        Behavior:  Behavior normal.        Thought Content: Thought content normal.        Judgment: Judgment normal.      Assessment and Plan: Obesity, diabetes, and hypertension syndrome ([place]) Assessment & Plan: Well controlled,  but her weight loss has stalled and she has regained 7 lbs since last visit on metformin.   Encouraged trial of Ozempic, as her  maternal GM had a history of thyroid CA which was not MTC or MEN.  Continue crestor, .  continue telmisartan. Samples of ozempic given.   Lab Results  Component Value Date   HGBA1C 6.6 (H) 12/06/2022   Lab Results  Component Value Date   MICROALBUR 1.6 12/06/2022   MICROALBUR 1.7 11/29/2021      Orders: -     Comprehensive metabolic panel -     Lipid panel -     Lipid panel -     Hemoglobin A1c -     Microalbumin / creatinine urine ratio  Cervical cancer screening -     Cytology - PAP  Weight gain -     TSH  Other fatigue -     CBC with Differential/Platelet  Tubular adenoma of colon Assessment & Plan: By diagnostic colonosopy after positive Cologuard test;  5 yr follow up due in 2024   DES exposure in utero Assessment & Plan: She continues to have annual cervical ca screening.  PAP was done today    Other orders -     Semaglutide(0.25 or 0.'5MG'$ /DOS); Inject 0.25 mg into the skin once a week. For 4 weeks,  then increase dose to 0.5 mg weekly for 4 doses  Dispense: 3 mL; Refill: 1 -     Semaglutide (1 MG/DOSE); Inject 1 mg as directed once a week.  Dispense: 3 mL; Refill: 1    No follow-ups on file.  Crecencio Mc, MD

## 2022-12-06 NOTE — Patient Instructions (Signed)
I am  recommending adding Ozempic  to help you lose weight    ozempic is a medication that is taken as a weekly subcutaneous injection. It is not insulin.  It  causes your pancreas to increase its  own insulin secretion  And also slows down the emptying of your stomach,  So it decreases your appetite and helps you lose weight.  The dose for the first 4 weekly doses is 0.25 mg.  You may have mild nausea on the first or second day but this should resolve.  If not ,stop the medication.   AFTER 4 WEEKS  can either continue the 0.25 mg  dose you  or increase the dose to 0.5 mg weekly.  The next dose up from 0.5 mg is 1.0 and has been put on file at [facility] for you  .    Do not ignore the need to exercise !  Start walking for 30 minutes daily if you are not already  Continue metformin as well , if you are tolerating it (it may prevent constipation that often occurs with ozempic).  Or try using otc magnesium supplements to stimulate the bowels   The CBG monitor is awesome! Check sugars as many times daily as you want.  More data = more control.

## 2022-12-07 ENCOUNTER — Other Ambulatory Visit: Payer: Self-pay | Admitting: Internal Medicine

## 2022-12-07 ENCOUNTER — Other Ambulatory Visit: Payer: Self-pay

## 2022-12-07 LAB — CBC WITH DIFFERENTIAL/PLATELET
Basophils Absolute: 0.1 10*3/uL (ref 0.0–0.1)
Basophils Relative: 0.8 % (ref 0.0–3.0)
Eosinophils Absolute: 0.1 10*3/uL (ref 0.0–0.7)
Eosinophils Relative: 0.9 % (ref 0.0–5.0)
HCT: 42.3 % (ref 36.0–46.0)
Hemoglobin: 14.5 g/dL (ref 12.0–15.0)
Lymphocytes Relative: 28.1 % (ref 12.0–46.0)
Lymphs Abs: 2.1 10*3/uL (ref 0.7–4.0)
MCHC: 34.2 g/dL (ref 30.0–36.0)
MCV: 93.3 fl (ref 78.0–100.0)
Monocytes Absolute: 0.5 10*3/uL (ref 0.1–1.0)
Monocytes Relative: 6.5 % (ref 3.0–12.0)
Neutro Abs: 4.7 10*3/uL (ref 1.4–7.7)
Neutrophils Relative %: 63.7 % (ref 43.0–77.0)
Platelets: 280 10*3/uL (ref 150.0–400.0)
RBC: 4.54 Mil/uL (ref 3.87–5.11)
RDW: 12.9 % (ref 11.5–15.5)
WBC: 7.4 10*3/uL (ref 4.0–10.5)

## 2022-12-07 LAB — COMPREHENSIVE METABOLIC PANEL
ALT: 20 U/L (ref 0–35)
AST: 17 U/L (ref 0–37)
Albumin: 4 g/dL (ref 3.5–5.2)
Alkaline Phosphatase: 73 U/L (ref 39–117)
BUN: 17 mg/dL (ref 6–23)
CO2: 25 mEq/L (ref 19–32)
Calcium: 9.3 mg/dL (ref 8.4–10.5)
Chloride: 102 mEq/L (ref 96–112)
Creatinine, Ser: 0.82 mg/dL (ref 0.40–1.20)
GFR: 79.89 mL/min (ref >60.00)
Glucose, Bld: 152 mg/dL — ABNORMAL HIGH (ref 70–99)
Potassium: 4.1 mEq/L (ref 3.5–5.1)
Sodium: 136 mEq/L (ref 135–145)
Total Bilirubin: 0.7 mg/dL (ref 0.2–1.2)
Total Protein: 6.8 g/dL (ref 6.0–8.3)

## 2022-12-07 LAB — MICROALBUMIN / CREATININE URINE RATIO
Creatinine,U: 282.2 mg/dL
Microalb Creat Ratio: 0.6 mg/g (ref 0.0–30.0)
Microalb, Ur: 1.6 mg/dL (ref 0.0–1.9)

## 2022-12-07 LAB — LIPID PANEL
Cholesterol: 171 mg/dL (ref 0–200)
HDL: 60.4 mg/dL (ref >39.00)
LDL Cholesterol: 82 mg/dL (ref 0–99)
NonHDL: 111.07
Total CHOL/HDL Ratio: 3
Triglycerides: 144 mg/dL (ref 0.0–149.0)
VLDL: 28.8 mg/dL (ref 0.0–40.0)

## 2022-12-07 LAB — TSH: TSH: 1.57 u[IU]/mL (ref 0.35–5.50)

## 2022-12-07 LAB — HEMOGLOBIN A1C: Hgb A1c MFr Bld: 6.6 % — ABNORMAL HIGH (ref 4.6–6.5)

## 2022-12-07 MED ORDER — METFORMIN HCL ER 500 MG PO TB24
500.0000 mg | ORAL_TABLET | Freq: Every day | ORAL | 1 refills | Status: DC
  Filled 2022-12-07: qty 90, 90d supply, fill #0
  Filled 2023-05-09: qty 90, 90d supply, fill #1

## 2022-12-07 MED ORDER — ROSUVASTATIN CALCIUM 5 MG PO TABS
5.0000 mg | ORAL_TABLET | Freq: Every day | ORAL | 1 refills | Status: DC
  Filled 2022-12-07: qty 90, 90d supply, fill #0
  Filled 2023-05-09: qty 90, 90d supply, fill #1

## 2022-12-09 NOTE — Assessment & Plan Note (Signed)
She continues to have annual cervical ca screening.  PAP was done today

## 2022-12-09 NOTE — Assessment & Plan Note (Addendum)
Well controlled,  but her weight loss has stalled and she has regained 7 lbs since last visit on metformin.   Encouraged trial of Ozempic, as her  maternal GM had a history of thyroid CA which was not MTC or MEN.    Continue crestor, .  continue telmisartan. Samples of ozempic given.   Lab Results  Component Value Date   HGBA1C 6.6 (H) 12/06/2022   Lab Results  Component Value Date   MICROALBUR 1.6 12/06/2022   MICROALBUR 1.7 11/29/2021

## 2022-12-09 NOTE — Assessment & Plan Note (Signed)
By diagnostic colonosopy after positive Cologuard test;  5 yr follow up due in 2024

## 2022-12-11 ENCOUNTER — Other Ambulatory Visit (HOSPITAL_COMMUNITY): Payer: Self-pay

## 2022-12-12 ENCOUNTER — Encounter: Payer: Self-pay | Admitting: Internal Medicine

## 2022-12-12 LAB — CYTOLOGY - PAP
Chlamydia: NEGATIVE
Comment: NEGATIVE
Comment: NEGATIVE
Comment: NEGATIVE
Comment: NORMAL
Diagnosis: NEGATIVE
High risk HPV: NEGATIVE
Neisseria Gonorrhea: NEGATIVE
Trichomonas: NEGATIVE

## 2022-12-13 NOTE — Telephone Encounter (Signed)
Is it okay to send in a rx for the [facility] to see if insurance will cover it?

## 2022-12-14 ENCOUNTER — Other Ambulatory Visit: Payer: Self-pay

## 2022-12-14 MED ORDER — FREESTYLE LIBRE 3 READER DEVI
1.0000 | 11 refills | Status: AC
  Filled 2022-12-14: qty 2, fill #0

## 2022-12-14 MED ORDER — FREESTYLE LIBRE 3 SENSOR MISC
1.0000 | 11 refills | Status: DC
  Filled 2022-12-14: qty 2, 28d supply, fill #0
  Filled 2023-01-09: qty 2, 28d supply, fill #1
  Filled 2023-02-06: qty 2, 28d supply, fill #2
  Filled 2023-03-05: qty 2, 28d supply, fill #3
  Filled 2023-03-28: qty 2, 28d supply, fill #4
  Filled 2023-04-30: qty 2, 28d supply, fill #5
  Filled 2023-05-17: qty 2, 28d supply, fill #6
  Filled 2023-06-18: qty 2, 28d supply, fill #7
  Filled 2023-07-16: qty 2, 28d supply, fill #8
  Filled 2023-08-13: qty 2, 28d supply, fill #9
  Filled 2023-09-07: qty 2, 28d supply, fill #10
  Filled 2023-10-05: qty 2, 28d supply, fill #11

## 2023-01-09 ENCOUNTER — Other Ambulatory Visit: Payer: Self-pay

## 2023-02-06 ENCOUNTER — Other Ambulatory Visit: Payer: Self-pay

## 2023-02-13 ENCOUNTER — Other Ambulatory Visit: Payer: Self-pay | Admitting: Internal Medicine

## 2023-02-13 ENCOUNTER — Other Ambulatory Visit: Payer: Self-pay

## 2023-02-13 DIAGNOSIS — H5213 Myopia, bilateral: Secondary | ICD-10-CM | POA: Diagnosis not present

## 2023-02-13 LAB — HM DIABETES EYE EXAM

## 2023-02-13 MED ORDER — BUSPIRONE HCL 30 MG PO TABS
30.0000 mg | ORAL_TABLET | Freq: Two times a day (BID) | ORAL | 3 refills | Status: DC
  Filled 2023-02-13: qty 180, 90d supply, fill #0
  Filled 2023-05-17: qty 180, 90d supply, fill #1
  Filled 2023-08-24: qty 180, 90d supply, fill #2
  Filled 2023-11-28: qty 180, 90d supply, fill #3

## 2023-02-15 ENCOUNTER — Other Ambulatory Visit: Payer: Self-pay

## 2023-03-05 ENCOUNTER — Other Ambulatory Visit: Payer: Self-pay

## 2023-03-26 ENCOUNTER — Other Ambulatory Visit: Payer: Self-pay

## 2023-03-29 ENCOUNTER — Other Ambulatory Visit: Payer: Self-pay

## 2023-04-30 ENCOUNTER — Other Ambulatory Visit: Payer: Self-pay

## 2023-05-01 ENCOUNTER — Other Ambulatory Visit: Payer: Self-pay

## 2023-05-17 ENCOUNTER — Other Ambulatory Visit: Payer: Self-pay | Admitting: Internal Medicine

## 2023-05-17 ENCOUNTER — Other Ambulatory Visit: Payer: Self-pay

## 2023-05-21 ENCOUNTER — Other Ambulatory Visit: Payer: Self-pay

## 2023-05-21 ENCOUNTER — Other Ambulatory Visit: Payer: Self-pay | Admitting: Internal Medicine

## 2023-05-22 ENCOUNTER — Other Ambulatory Visit: Payer: Self-pay

## 2023-05-22 MED FILL — Telmisartan Tab 20 MG: ORAL | 90 days supply | Qty: 90 | Fill #0 | Status: AC

## 2023-05-22 MED FILL — Fluoxetine HCl Cap 40 MG: ORAL | 90 days supply | Qty: 90 | Fill #0 | Status: AC

## 2023-05-30 ENCOUNTER — Ambulatory Visit: Payer: Commercial Managed Care - PPO | Admitting: Internal Medicine

## 2023-05-30 ENCOUNTER — Encounter: Payer: Self-pay | Admitting: Internal Medicine

## 2023-05-30 ENCOUNTER — Other Ambulatory Visit: Payer: Self-pay

## 2023-05-30 VITALS — BP 110/84 | HR 77 | Temp 98.0°F | Ht 64.0 in | Wt 268.8 lb

## 2023-05-30 DIAGNOSIS — E1169 Type 2 diabetes mellitus with other specified complication: Secondary | ICD-10-CM

## 2023-05-30 DIAGNOSIS — Z7985 Long-term (current) use of injectable non-insulin antidiabetic drugs: Secondary | ICD-10-CM

## 2023-05-30 DIAGNOSIS — E785 Hyperlipidemia, unspecified: Secondary | ICD-10-CM | POA: Diagnosis not present

## 2023-05-30 DIAGNOSIS — E669 Obesity, unspecified: Secondary | ICD-10-CM | POA: Diagnosis not present

## 2023-05-30 DIAGNOSIS — E1159 Type 2 diabetes mellitus with other circulatory complications: Secondary | ICD-10-CM | POA: Diagnosis not present

## 2023-05-30 DIAGNOSIS — R5383 Other fatigue: Secondary | ICD-10-CM | POA: Diagnosis not present

## 2023-05-30 DIAGNOSIS — Z7984 Long term (current) use of oral hypoglycemic drugs: Secondary | ICD-10-CM | POA: Diagnosis not present

## 2023-05-30 DIAGNOSIS — I152 Hypertension secondary to endocrine disorders: Secondary | ICD-10-CM | POA: Diagnosis not present

## 2023-05-30 DIAGNOSIS — Z1231 Encounter for screening mammogram for malignant neoplasm of breast: Secondary | ICD-10-CM | POA: Diagnosis not present

## 2023-05-30 DIAGNOSIS — R7989 Other specified abnormal findings of blood chemistry: Secondary | ICD-10-CM

## 2023-05-30 DIAGNOSIS — Z1211 Encounter for screening for malignant neoplasm of colon: Secondary | ICD-10-CM

## 2023-05-30 MED ORDER — SEMAGLUTIDE (2 MG/DOSE) 8 MG/3ML ~~LOC~~ SOPN
2.0000 mg | PEN_INJECTOR | SUBCUTANEOUS | 2 refills | Status: DC
  Filled 2023-05-30 – 2023-06-05 (×3): qty 3, 28d supply, fill #0
  Filled 2023-07-02: qty 3, 28d supply, fill #1
  Filled 2023-07-31: qty 3, 28d supply, fill #2

## 2023-05-30 MED ORDER — SEMAGLUTIDE (1 MG/DOSE) 4 MG/3ML ~~LOC~~ SOPN
1.0000 mg | PEN_INJECTOR | SUBCUTANEOUS | 1 refills | Status: DC
  Filled 2023-05-30: qty 3, 28d supply, fill #0

## 2023-05-30 MED ORDER — METFORMIN HCL ER 500 MG PO TB24
500.0000 mg | ORAL_TABLET | Freq: Every day | ORAL | 1 refills | Status: DC
  Filled 2023-05-30 – 2023-08-13 (×2): qty 90, 90d supply, fill #0
  Filled 2023-11-28: qty 90, 90d supply, fill #1

## 2023-05-30 MED ORDER — ROSUVASTATIN CALCIUM 5 MG PO TABS
5.0000 mg | ORAL_TABLET | Freq: Every day | ORAL | 1 refills | Status: DC
  Filled 2023-05-30 – 2023-08-13 (×2): qty 90, 90d supply, fill #0
  Filled 2023-11-28: qty 90, 90d supply, fill #1

## 2023-05-30 NOTE — Patient Instructions (Signed)
Congratulations on the weight loss!    I have increased the dose of ozempic to 2 mg for your next refill  Try placing the monitor on your anterior thigh   Confirm the low BS with an accu check (fingerstick)  Dc metformin if they are real and continue to occur

## 2023-05-30 NOTE — Progress Notes (Signed)
Subjective:  Patient ID: Laura Gray, female    DOB: 04-13-1966  Age: 57 y.o. MRN: [id number]  CC: The primary encounter diagnosis was Colon cancer screening. Diagnoses of Encounter for screening mammogram for malignant neoplasm of breast, Obesity, diabetes, and hypertension syndrome (HCC), Other fatigue, Hyperlipidemia, unspecified hyperlipidemia type, and Abnormal TSH were also pertinent to this visit.   HPI KELSEE SKEETE presents for  Chief Complaint  Patient presents with   Medical Management of Chronic Issues    Weight management and diabetes   Advice Only    1) type 2 DM:/obesity/HTN:  She feels generally well,  and using Ozempic to lose weight.  Currently taking 1 mg weekly dose.  Has lost 21 lbs since December.  Checking  blood sugars less than once daily at variable times, usually only if she feels she may be having a hypoglycemic event. .  BS have been under 130 fasting and < 150 post prandially.  Denies any recent hypoglyemic events.  Taking   medications as directed. Following a carbohydrate modified diet 6 days per week. Denies numbness, burning and tingling of extremities. Appetite is good.    2) Patient is taking her medications as prescribed and notes no adverse effects.  Home BP readings have been done about once per week and are  generally < 130/80 .  She is avoiding added salt in her diet and walking regularly about 3 times per week for exercise  .    Outpatient Medications Prior to Visit  Medication Sig Dispense Refill   busPIRone (BUSPAR) 30 MG tablet Take 1 tablet (30 mg total) by mouth 2 (two) times daily. 180 tablet 3   cetirizine (ZYRTEC) 10 MG tablet Take 10 mg by mouth as needed.     Continuous Blood Gluc Receiver (FREESTYLE LIBRE 3 READER) DEVI 1 Device by Does not apply route every 14 (fourteen) days. TO CHECK SUGARS 6 TIMES DAILY (PRE AND 2 HR POST MEALS) 2 each 11   Continuous Glucose Sensor (FREESTYLE LIBRE 3 SENSOR) MISC 1 Device by Does not apply route  every 14 (fourteen) days. Place 1 sensor on the skin every 14 days. Use to check glucose continuously 2 each 11   FLUoxetine (PROZAC) 40 MG capsule Take 1 capsule (40 mg total) by mouth daily. 90 capsule 1   Melatonin 1 MG CAPS Take 1 capsule by mouth at bedtime as needed.     telmisartan (MICARDIS) 20 MG tablet Take 1 tablet (20 mg total) by mouth at bedtime. 90 tablet 1   metFORMIN (GLUCOPHAGE-XR) 500 MG 24 hr tablet Take 1 tablet (500 mg total) by mouth daily with breakfast. 90 tablet 1   rosuvastatin (CRESTOR) 5 MG tablet Take 1 tablet (5 mg total) by mouth daily. 90 tablet 1   Semaglutide, 1 MG/DOSE, 4 MG/3ML SOPN Inject 1 mg as directed once a week. 3 mL 1   Semaglutide,0.25 or 0.5MG /DOS, 2 MG/3ML SOPN Inject 0.25 mg into the skin once a week. For 4 weeks,  then increase dose to 0.5 mg weekly for 4 doses (Patient not taking: Reported on 05/30/2023) 3 mL 1   No facility-administered medications prior to visit.    Review of Systems;  Patient denies headache, fevers, malaise, unintentional weight loss, skin rash, eye pain, sinus congestion and sinus pain, sore throat, dysphagia,  hemoptysis , cough, dyspnea, wheezing, chest pain, palpitations, orthopnea, edema, abdominal pain, nausea, melena, diarrhea, constipation, flank pain, dysuria, hematuria, urinary  Frequency, nocturia, numbness, tingling, seizures,  Focal weakness, Loss of consciousness,  Tremor, insomnia, depression, anxiety, and suicidal ideation.      Objective:  BP 110/84   Pulse 77   Temp 98 F (36.7 C) (Oral)   Ht 5\' 4"  (1.626 m)   Wt 268 lb 12.8 oz (121.9 kg)   LMP 07/24/2015   SpO2 96%   BMI 46.14 kg/m   BP Readings from Last 3 Encounters:  05/30/23 110/84  12/06/22 118/82  05/30/22 114/72    Wt Readings from Last 3 Encounters:  05/30/23 268 lb 12.8 oz (121.9 kg)  12/06/22 288 lb 11.2 oz (131 kg)  05/30/22 282 lb 12.8 oz (128.3 kg)    Physical Exam Vitals reviewed.  Constitutional:      General: She is  not in acute distress.    Appearance: Normal appearance. She is normal weight. She is not ill-appearing, toxic-appearing or diaphoretic.  HENT:     Head: Normocephalic.  Eyes:     General: No scleral icterus.       Right eye: No discharge.        Left eye: No discharge.     Conjunctiva/sclera: Conjunctivae normal.  Cardiovascular:     Rate and Rhythm: Normal rate and regular rhythm.     Heart sounds: Normal heart sounds.  Pulmonary:     Effort: Pulmonary effort is normal. No respiratory distress.     Breath sounds: Normal breath sounds.  Musculoskeletal:        General: Normal range of motion.  Skin:    General: Skin is warm and dry.  Neurological:     General: No focal deficit present.     Mental Status: She is alert and oriented to person, place, and time. Mental status is at baseline.  Psychiatric:        Mood and Affect: Mood normal.        Behavior: Behavior normal.        Thought Content: Thought content normal.        Judgment: Judgment normal.    Lab Results  Component Value Date   HGBA1C 4.9 05/30/2023   HGBA1C 6.6 (H) 12/06/2022   HGBA1C 6.1 05/30/2022    Lab Results  Component Value Date   CREATININE 0.90 05/30/2023   CREATININE 0.82 12/06/2022   CREATININE 0.87 06/20/2022    Lab Results  Component Value Date   WBC 7.4 12/06/2022   HGB 14.5 12/06/2022   HCT 42.3 12/06/2022   PLT 280.0 12/06/2022   GLUCOSE 98 05/30/2023   CHOL 138 05/30/2023   TRIG 102.0 05/30/2023   HDL 57.00 05/30/2023   LDLDIRECT 69.0 05/30/2023   LDLCALC 61 05/30/2023   ALT 21 05/30/2023   AST 18 05/30/2023   NA 136 05/30/2023   K 4.5 05/30/2023   CL 102 05/30/2023   CREATININE 0.90 05/30/2023   BUN 13 05/30/2023   CO2 24 05/30/2023   TSH 0.25 (L) 05/30/2023   HGBA1C 4.9 05/30/2023   MICROALBUR 1.6 12/06/2022    No results found.  Assessment & Plan:  .Colon cancer screening -     Ambulatory referral to Gastroenterology  Encounter for screening mammogram for  malignant neoplasm of breast -     3D Screening Mammogram, Left and Right; Future  Obesity, diabetes, and hypertension syndrome (HCC) Assessment & Plan: Well controlled,  with 24 lb st loss  using Ozempic and  metformin.  Continue crestor, .  continue telmisartan.   Lab Results  Component Value Date   HGBA1C 4.9  05/30/2023   Lab Results  Component Value Date   MICROALBUR 1.6 12/06/2022   MICROALBUR 1.7 11/29/2021      Orders: -     Hemoglobin A1c -     Comprehensive metabolic panel -     Hemoglobin A1c; Future -     Comprehensive metabolic panel; Future -     Lipid Panel w/reflex Direct LDL; Future -     Microalbumin / creatinine urine ratio; Future  Other fatigue -     TSH  Hyperlipidemia, unspecified hyperlipidemia type -     Lipid panel -     LDL cholesterol, direct  Abnormal TSH Assessment & Plan: Thyroid was overactive today. She has no symptoms . Will recheck in  6 weeks  Lab Results  Component Value Date   TSH 0.25 (L) 05/30/2023     Orders: -     TSH; Future  Other orders -     metFORMIN HCl ER; Take 1 tablet (500 mg total) by mouth daily with breakfast.  Dispense: 90 tablet; Refill: 1 -     Rosuvastatin Calcium; Take 1 tablet (5 mg total) by mouth daily.  Dispense: 90 tablet; Refill: 1 -     Semaglutide (2 MG/DOSE); Inject 2 mg as directed once a week.  Dispense: 3 mL; Refill: 2    Follow-up: Return in about 6 months (around 11/29/2023) for physical.   Sherlene Shams, MD

## 2023-05-31 ENCOUNTER — Other Ambulatory Visit: Payer: Self-pay

## 2023-05-31 LAB — COMPREHENSIVE METABOLIC PANEL
ALT: 21 U/L (ref 0–35)
AST: 18 U/L (ref 0–37)
Albumin: 4.1 g/dL (ref 3.5–5.2)
Alkaline Phosphatase: 73 U/L (ref 39–117)
BUN: 13 mg/dL (ref 6–23)
CO2: 24 mEq/L (ref 19–32)
Calcium: 9.6 mg/dL (ref 8.4–10.5)
Chloride: 102 mEq/L (ref 96–112)
Creatinine, Ser: 0.9 mg/dL (ref 0.40–1.20)
GFR: 71.21 mL/min (ref >60.00)
Glucose, Bld: 98 mg/dL (ref 70–99)
Potassium: 4.5 mEq/L (ref 3.5–5.1)
Sodium: 136 mEq/L (ref 135–145)
Total Bilirubin: 0.7 mg/dL (ref 0.2–1.2)
Total Protein: 7.6 g/dL (ref 6.0–8.3)

## 2023-05-31 LAB — LIPID PANEL
Cholesterol: 138 mg/dL (ref 0–200)
HDL: 57 mg/dL (ref >39.00)
LDL Cholesterol: 61 mg/dL (ref 0–99)
NonHDL: 81.28
Total CHOL/HDL Ratio: 2
Triglycerides: 102 mg/dL (ref 0.0–149.0)
VLDL: 20.4 mg/dL (ref 0.0–40.0)

## 2023-05-31 LAB — TSH: TSH: 0.25 u[IU]/mL — ABNORMAL LOW (ref 0.35–5.50)

## 2023-05-31 LAB — HEMOGLOBIN A1C: Hgb A1c MFr Bld: 4.9 % (ref 4.6–6.5)

## 2023-05-31 LAB — LDL CHOLESTEROL, DIRECT: Direct LDL: 69 mg/dL

## 2023-06-01 DIAGNOSIS — R7989 Other specified abnormal findings of blood chemistry: Secondary | ICD-10-CM | POA: Insufficient documentation

## 2023-06-01 DIAGNOSIS — E0590 Thyrotoxicosis, unspecified without thyrotoxic crisis or storm: Secondary | ICD-10-CM | POA: Insufficient documentation

## 2023-06-01 NOTE — Assessment & Plan Note (Signed)
Well controlled,  with 24 lb st loss  using Ozempic and  metformin.  Continue crestor, .  continue telmisartan.   Lab Results  Component Value Date   HGBA1C 4.9 05/30/2023   Lab Results  Component Value Date   MICROALBUR 1.6 12/06/2022   MICROALBUR 1.7 11/29/2021

## 2023-06-01 NOTE — Assessment & Plan Note (Signed)
Thyroid was overactive today. She has no symptoms . Will recheck in  6 weeks  Lab Results  Component Value Date   TSH 0.25 (L) 05/30/2023

## 2023-06-05 ENCOUNTER — Encounter: Payer: Self-pay | Admitting: Internal Medicine

## 2023-06-05 ENCOUNTER — Other Ambulatory Visit: Payer: Self-pay

## 2023-06-08 ENCOUNTER — Other Ambulatory Visit: Payer: Self-pay

## 2023-06-13 ENCOUNTER — Encounter: Payer: Self-pay | Admitting: *Deleted

## 2023-06-18 ENCOUNTER — Other Ambulatory Visit: Payer: Self-pay

## 2023-06-19 ENCOUNTER — Other Ambulatory Visit: Payer: Self-pay

## 2023-06-20 ENCOUNTER — Other Ambulatory Visit (HOSPITAL_BASED_OUTPATIENT_CLINIC_OR_DEPARTMENT_OTHER): Payer: Self-pay

## 2023-07-11 ENCOUNTER — Other Ambulatory Visit (INDEPENDENT_AMBULATORY_CARE_PROVIDER_SITE_OTHER): Payer: Commercial Managed Care - PPO

## 2023-07-11 DIAGNOSIS — E0590 Thyrotoxicosis, unspecified without thyrotoxic crisis or storm: Secondary | ICD-10-CM

## 2023-07-11 DIAGNOSIS — I152 Hypertension secondary to endocrine disorders: Secondary | ICD-10-CM | POA: Diagnosis not present

## 2023-07-11 DIAGNOSIS — E669 Obesity, unspecified: Secondary | ICD-10-CM | POA: Diagnosis not present

## 2023-07-11 DIAGNOSIS — R7989 Other specified abnormal findings of blood chemistry: Secondary | ICD-10-CM | POA: Diagnosis not present

## 2023-07-11 DIAGNOSIS — E1169 Type 2 diabetes mellitus with other specified complication: Secondary | ICD-10-CM

## 2023-07-11 DIAGNOSIS — E1159 Type 2 diabetes mellitus with other circulatory complications: Secondary | ICD-10-CM | POA: Diagnosis not present

## 2023-07-12 LAB — COMPREHENSIVE METABOLIC PANEL
ALT: 18 U/L (ref 0–35)
AST: 14 U/L (ref 0–37)
Albumin: 3.9 g/dL (ref 3.5–5.2)
Alkaline Phosphatase: 73 U/L (ref 39–117)
BUN: 12 mg/dL (ref 6–23)
CO2: 27 mEq/L (ref 19–32)
Calcium: 9.8 mg/dL (ref 8.4–10.5)
Chloride: 103 mEq/L (ref 96–112)
Creatinine, Ser: 0.88 mg/dL (ref 0.40–1.20)
GFR: 73.09 mL/min (ref >60.00)
Glucose, Bld: 105 mg/dL — ABNORMAL HIGH (ref 70–99)
Potassium: 4.4 mEq/L (ref 3.5–5.1)
Sodium: 138 mEq/L (ref 135–145)
Total Bilirubin: 0.6 mg/dL (ref 0.2–1.2)
Total Protein: 6.7 g/dL (ref 6.0–8.3)

## 2023-07-12 LAB — TSH: TSH: 0.25 u[IU]/mL — ABNORMAL LOW (ref 0.35–5.50)

## 2023-07-12 LAB — LIPID PANEL W/REFLEX DIRECT LDL
Cholesterol: 150 mg/dL (ref <200)
HDL: 62 mg/dL (ref >=50)
LDL Cholesterol (Calc): 69 mg/dL (calc)
Non-HDL Cholesterol (Calc): 88 mg/dL (calc) (ref <130)
Total CHOL/HDL Ratio: 2.4 (calc) (ref <5.0)
Triglycerides: 102 mg/dL (ref <150)

## 2023-07-12 LAB — HEMOGLOBIN A1C: Hgb A1c MFr Bld: 4.8 % (ref 4.6–6.5)

## 2023-07-14 ENCOUNTER — Encounter: Payer: Self-pay | Admitting: Internal Medicine

## 2023-07-14 NOTE — Addendum Note (Signed)
Addended by: Sherlene Shams on: 07/14/2023 04:14 PM   Modules accepted: Orders

## 2023-07-17 ENCOUNTER — Other Ambulatory Visit: Payer: Self-pay

## 2023-07-17 ENCOUNTER — Other Ambulatory Visit (INDEPENDENT_AMBULATORY_CARE_PROVIDER_SITE_OTHER): Payer: Commercial Managed Care - PPO

## 2023-07-17 DIAGNOSIS — E0590 Thyrotoxicosis, unspecified without thyrotoxic crisis or storm: Secondary | ICD-10-CM

## 2023-07-18 LAB — THYROGLOBULIN ANTIBODY: Thyroglobulin Ab: <1 IU/mL (ref <=1)

## 2023-07-18 LAB — THYROID PANEL WITH TSH
Free Thyroxine Index: 2.6 (ref 1.4–3.8)
T3 Uptake: 27 % (ref 22–35)
T4, Total: 9.6 ug/dL (ref 5.1–11.9)
TSH: 0.39 mIU/L — ABNORMAL LOW (ref 0.40–4.50)

## 2023-07-18 LAB — THYROGLOBULIN LEVEL: Thyroglobulin: 32.5 ng/mL

## 2023-07-18 LAB — THYROID PEROXIDASE ANTIBODY: Thyroperoxidase Ab SerPl-aCnc: 1 IU/mL (ref <9)

## 2023-08-13 ENCOUNTER — Other Ambulatory Visit: Payer: Self-pay

## 2023-08-22 ENCOUNTER — Ambulatory Visit
Admission: RE | Admit: 2023-08-22 | Discharge: 2023-08-22 | Disposition: A | Discharge status: Home / Self Care | Payer: Commercial Managed Care - PPO | Source: Ambulatory Visit | Attending: Internal Medicine | Admitting: Internal Medicine

## 2023-08-22 DIAGNOSIS — Z1231 Encounter for screening mammogram for malignant neoplasm of breast: Secondary | ICD-10-CM | POA: Diagnosis not present

## 2023-08-24 ENCOUNTER — Other Ambulatory Visit: Payer: Self-pay

## 2023-08-24 ENCOUNTER — Other Ambulatory Visit: Payer: Self-pay | Admitting: Internal Medicine

## 2023-08-24 DIAGNOSIS — E1169 Type 2 diabetes mellitus with other specified complication: Secondary | ICD-10-CM

## 2023-08-24 MED FILL — Telmisartan Tab 20 MG: ORAL | 90 days supply | Qty: 90 | Fill #1 | Status: AC

## 2023-08-24 MED FILL — Fluoxetine HCl Cap 40 MG: ORAL | 90 days supply | Qty: 90 | Fill #1 | Status: AC

## 2023-08-27 ENCOUNTER — Other Ambulatory Visit: Payer: Self-pay

## 2023-08-28 ENCOUNTER — Other Ambulatory Visit: Payer: Self-pay

## 2023-08-28 MED FILL — Semaglutide Soln Pen-inj 2 MG/DOSE (8 MG/3ML): SUBCUTANEOUS | 28 days supply | Qty: 3 | Fill #0 | Status: AC

## 2023-09-09 ENCOUNTER — Other Ambulatory Visit: Payer: Self-pay

## 2023-09-23 MED FILL — Semaglutide Soln Pen-inj 2 MG/DOSE (8 MG/3ML): SUBCUTANEOUS | 28 days supply | Qty: 3 | Fill #1 | Status: AC

## 2023-10-04 ENCOUNTER — Other Ambulatory Visit: Payer: Self-pay

## 2023-10-04 ENCOUNTER — Ambulatory Visit: Payer: Commercial Managed Care - PPO

## 2023-10-04 ENCOUNTER — Ambulatory Visit: Payer: Commercial Managed Care - PPO | Admitting: Internal Medicine

## 2023-10-04 ENCOUNTER — Encounter: Payer: Self-pay | Admitting: Internal Medicine

## 2023-10-04 VITALS — BP 126/78 | HR 84 | Temp 98.2°F | Ht 64.0 in | Wt 253.8 lb

## 2023-10-04 DIAGNOSIS — R7989 Other specified abnormal findings of blood chemistry: Secondary | ICD-10-CM | POA: Diagnosis not present

## 2023-10-04 DIAGNOSIS — E1169 Type 2 diabetes mellitus with other specified complication: Secondary | ICD-10-CM

## 2023-10-04 DIAGNOSIS — R1011 Right upper quadrant pain: Secondary | ICD-10-CM | POA: Insufficient documentation

## 2023-10-04 DIAGNOSIS — E1159 Type 2 diabetes mellitus with other circulatory complications: Secondary | ICD-10-CM

## 2023-10-04 DIAGNOSIS — E669 Obesity, unspecified: Secondary | ICD-10-CM | POA: Diagnosis not present

## 2023-10-04 DIAGNOSIS — Z7985 Long-term (current) use of injectable non-insulin antidiabetic drugs: Secondary | ICD-10-CM | POA: Diagnosis not present

## 2023-10-04 DIAGNOSIS — R109 Unspecified abdominal pain: Secondary | ICD-10-CM | POA: Diagnosis not present

## 2023-10-04 DIAGNOSIS — I152 Hypertension secondary to endocrine disorders: Secondary | ICD-10-CM

## 2023-10-04 DIAGNOSIS — Z7984 Long term (current) use of oral hypoglycemic drugs: Secondary | ICD-10-CM | POA: Diagnosis not present

## 2023-10-04 DIAGNOSIS — K5900 Constipation, unspecified: Secondary | ICD-10-CM | POA: Diagnosis not present

## 2023-10-04 LAB — COMPREHENSIVE METABOLIC PANEL
ALT: 14 U/L (ref 0–35)
AST: 12 U/L (ref 0–37)
Albumin: 4 g/dL (ref 3.5–5.2)
Alkaline Phosphatase: 72 U/L (ref 39–117)
BUN: 12 mg/dL (ref 6–23)
CO2: 30 meq/L (ref 19–32)
Calcium: 10 mg/dL (ref 8.4–10.5)
Chloride: 100 meq/L (ref 96–112)
Creatinine, Ser: 0.87 mg/dL (ref 0.40–1.20)
GFR: 73.98 mL/min (ref >60.00)
Glucose, Bld: 104 mg/dL — ABNORMAL HIGH (ref 70–99)
Potassium: 4.1 meq/L (ref 3.5–5.1)
Sodium: 137 meq/L (ref 135–145)
Total Bilirubin: 0.5 mg/dL (ref 0.2–1.2)
Total Protein: 6.8 g/dL (ref 6.0–8.3)

## 2023-10-04 LAB — CBC WITH DIFFERENTIAL/PLATELET
Basophils Absolute: 0 10*3/uL (ref 0.0–0.1)
Basophils Relative: 0.4 % (ref 0.0–3.0)
Eosinophils Absolute: 0.1 10*3/uL (ref 0.0–0.7)
Eosinophils Relative: 1.5 % (ref 0.0–5.0)
HCT: 43.3 % (ref 36.0–46.0)
Hemoglobin: 14.5 g/dL (ref 12.0–15.0)
Lymphocytes Relative: 18.8 % (ref 12.0–46.0)
Lymphs Abs: 1.7 10*3/uL (ref 0.7–4.0)
MCHC: 33.5 g/dL (ref 30.0–36.0)
MCV: 95 fL (ref 78.0–100.0)
Monocytes Absolute: 0.7 10*3/uL (ref 0.1–1.0)
Monocytes Relative: 7.5 % (ref 3.0–12.0)
Neutro Abs: 6.6 10*3/uL (ref 1.4–7.7)
Neutrophils Relative %: 71.8 % (ref 43.0–77.0)
Platelets: 289 10*3/uL (ref 150.0–400.0)
RBC: 4.56 Mil/uL (ref 3.87–5.11)
RDW: 12.8 % (ref 11.5–15.5)
WBC: 9.2 10*3/uL (ref 4.0–10.5)

## 2023-10-04 LAB — LIPASE: Lipase: 32 U/L (ref 11.0–59.0)

## 2023-10-04 MED ORDER — TELMISARTAN 20 MG PO TABS
20.0000 mg | ORAL_TABLET | Freq: Every day | ORAL | 1 refills | Status: DC
  Filled 2023-10-04 – 2023-11-28 (×2): qty 90, 90d supply, fill #0
  Filled 2024-02-23: qty 90, 90d supply, fill #1

## 2023-10-04 MED ORDER — ONDANSETRON 4 MG PO TBDP
4.0000 mg | ORAL_TABLET | Freq: Three times a day (TID) | ORAL | 0 refills | Status: AC | PRN
  Filled 2023-10-04: qty 20, 7d supply, fill #0

## 2023-10-04 MED ORDER — FLUOXETINE HCL 40 MG PO CAPS
40.0000 mg | ORAL_CAPSULE | Freq: Every day | ORAL | 1 refills | Status: DC
  Filled 2023-10-04 – 2023-11-28 (×2): qty 90, 90d supply, fill #0
  Filled 2024-02-23: qty 90, 90d supply, fill #1

## 2023-10-04 NOTE — Progress Notes (Signed)
Subjective:  Patient ID: Laura Gray, female    DOB: 1966/10/08  Age: 57 y.o. MRN: [id number]  CC: The primary encounter diagnosis was Right upper quadrant abdominal pain. Diagnoses of Abdominal pain, RUQ, Obesity, diabetes, and hypertension syndrome (HCC), and Abnormal TSH were also pertinent to this visit.   HPI Laura Gray presents for  Chief Complaint  Patient presents with   Right upper abdominal pain   Laura Gray is a 57 yr old female with type 2 DM and obesity managed with metformin and Ozempic who present with a 2 week history of elevated blood sugars  and RUQ abdominal pain occurring after every meal accompanied   by nausea .  She has had emesis 3 times.  No fevers,  change in stool color .  Stools are infrequent due to use of ozempic ,  occurring  1-2 week .      Lab Results  Component Value Date   HGBA1C 4.8 07/11/2023      Outpatient Medications Prior to Visit  Medication Sig Dispense Refill   busPIRone (BUSPAR) 30 MG tablet Take 1 tablet (30 mg total) by mouth 2 (two) times daily. 180 tablet 3   cetirizine (ZYRTEC) 10 MG tablet Take 10 mg by mouth as needed.     Continuous Blood Gluc Receiver (FREESTYLE LIBRE 3 READER) DEVI 1 Device by Does not apply route every 14 (fourteen) days. TO CHECK SUGARS 6 TIMES DAILY (PRE AND 2 HR POST MEALS) 2 each 11   Continuous Glucose Sensor (FREESTYLE LIBRE 3 SENSOR) MISC Place 1 sensor on the skin every 14 days. Use to check glucose continuously 2 each 11   Melatonin 1 MG CAPS Take 1 capsule by mouth at bedtime as needed.     metFORMIN (GLUCOPHAGE-XR) 500 MG 24 hr tablet Take 1 tablet (500 mg total) by mouth daily with breakfast. 90 tablet 1   rosuvastatin (CRESTOR) 5 MG tablet Take 1 tablet (5 mg total) by mouth daily. 90 tablet 1   Semaglutide, 2 MG/DOSE, (OZEMPIC, 2 MG/DOSE,) 8 MG/3ML SOPN Inject 2 mg into the skin once a week. 3 mL 2   FLUoxetine (PROZAC) 40 MG capsule Take 1 capsule (40 mg total) by mouth daily. 90 capsule 1    telmisartan (MICARDIS) 20 MG tablet Take 1 tablet (20 mg total) by mouth at bedtime. 90 tablet 1   No facility-administered medications prior to visit.    Review of Systems;  Patient denies headache, fevers, malaise, unintentional weight loss, skin rash, eye pain, sinus congestion and sinus pain, sore throat, dysphagia,  hemoptysis , cough, dyspnea, wheezing, chest pain, palpitations, orthopnea, edema, abdominal pain, nausea, melena, diarrhea, constipation, flank pain, dysuria, hematuria, urinary  Frequency, nocturia, numbness, tingling, seizures,  Focal weakness, Loss of consciousness,  Tremor, insomnia, depression, anxiety, and suicidal ideation.      Objective:  BP 126/78   Pulse 84   Temp 98.2 F (36.8 C) (Oral)   Ht 5\' 4"  (1.626 m)   Wt 253 lb 12.8 oz (115.1 kg)   LMP 07/24/2015   SpO2 97%   BMI 43.56 kg/m   BP Readings from Last 3 Encounters:  10/04/23 126/78  05/30/23 110/84  12/06/22 118/82    Wt Readings from Last 3 Encounters:  10/04/23 253 lb 12.8 oz (115.1 kg)  05/30/23 268 lb 12.8 oz (121.9 kg)  12/06/22 288 lb 11.2 oz (131 kg)    Physical Exam Vitals reviewed.  Constitutional:      General: She is  not in acute distress.    Appearance: Normal appearance. She is normal weight. She is not ill-appearing, toxic-appearing or diaphoretic.  HENT:     Head: Normocephalic.  Eyes:     General: No scleral icterus.       Right eye: No discharge.        Left eye: No discharge.     Conjunctiva/sclera: Conjunctivae normal.  Cardiovascular:     Rate and Rhythm: Normal rate and regular rhythm.     Heart sounds: Normal heart sounds.  Pulmonary:     Effort: Pulmonary effort is normal. No respiratory distress.     Breath sounds: Normal breath sounds.  Abdominal:     General: Bowel sounds are decreased.     Palpations: Abdomen is soft.     Tenderness: There is abdominal tenderness in the right upper quadrant. There is no rebound.  Musculoskeletal:        General:  Normal range of motion.  Skin:    General: Skin is warm and dry.  Neurological:     General: No focal deficit present.     Mental Status: She is alert and oriented to person, place, and time. Mental status is at baseline.  Psychiatric:        Mood and Affect: Mood normal.        Behavior: Behavior normal.        Thought Content: Thought content normal.        Judgment: Judgment normal.    Lab Results  Component Value Date   HGBA1C 4.8 07/11/2023   HGBA1C 4.9 05/30/2023   HGBA1C 6.6 (H) 12/06/2022    Lab Results  Component Value Date   CREATININE 0.87 10/04/2023   CREATININE 0.88 07/11/2023   CREATININE 0.90 05/30/2023    Lab Results  Component Value Date   WBC 9.2 10/04/2023   HGB 14.5 10/04/2023   HCT 43.3 10/04/2023   PLT 289.0 10/04/2023   GLUCOSE 104 (H) 10/04/2023   CHOL 150 07/11/2023   TRIG 102 07/11/2023   HDL 62 07/11/2023   LDLDIRECT 69.0 05/30/2023   LDLCALC 69 07/11/2023   ALT 14 10/04/2023   AST 12 10/04/2023   NA 137 10/04/2023   K 4.1 10/04/2023   CL 100 10/04/2023   CREATININE 0.87 10/04/2023   BUN 12 10/04/2023   CO2 30 10/04/2023   TSH 0.39 (L) 07/17/2023   HGBA1C 4.8 07/11/2023   MICROALBUR 1.6 12/06/2022    MM 3D SCREENING MAMMOGRAM BILATERAL BREAST  Result Date: 08/24/2023 CLINICAL DATA:  Screening. EXAM: DIGITAL SCREENING BILATERAL MAMMOGRAM WITH TOMOSYNTHESIS AND CAD TECHNIQUE: Bilateral screening digital craniocaudal and mediolateral oblique mammograms were obtained. Bilateral screening digital breast tomosynthesis was performed. The images were evaluated with computer-aided detection. COMPARISON:  Previous exam(s). ACR Breast Density Category b: There are scattered areas of fibroglandular density. FINDINGS: There are no findings suspicious for malignancy. IMPRESSION: No mammographic evidence of malignancy. A result letter of this screening mammogram will be mailed directly to the patient. RECOMMENDATION: Screening mammogram in one year.  (Code:SM-B-01Y) BI-RADS CATEGORY  1: Negative. Electronically Signed   By: Hulan Saas M.D.   On: 08/24/2023 08:59    Assessment & Plan:  .Right upper quadrant abdominal pain -     Comprehensive metabolic panel -     Lipase -     CBC with Differential/Platelet -     DG Abd 1 View; Future  Abdominal pain, RUQ Assessment & Plan: LFTS and lipase are normal.  Cholelithiasis and  biliary cholic suspected.  Abd ultrasound ordered   Obesity, diabetes, and hypertension syndrome (HCC) Assessment & Plan: Well controlled,  with weight loss of 40 lb   since 2022  using Ozempic and  metformin.  Continue crestor, .  continue telmisartan. Current  exam of upper abd pain and 2 week history of  RUQ pain worrisome for gallstone pancreatitis .  Lfts  lipase plain abd films followed by liver u/s vs CT ab and pelvis   Lab Results  Component Value Date   HGBA1C 4.8 07/11/2023   Lab Results  Component Value Date   MICROALBUR 1.6 12/06/2022   MICROALBUR 1.7 11/29/2021      Orders: -     Microalbumin / creatinine urine ratio; Future  Abnormal TSH Assessment & Plan: Thyroid was overactive today. She has no symptoms . Tsh was not done due to acute visit.  Lab Results  Component Value Date   TSH 0.39 (L) 07/17/2023     Orders: -     TSH; Future  Other orders -     FLUoxetine HCl; Take 1 capsule (40 mg total) by mouth daily.  Dispense: 90 capsule; Refill: 1 -     Telmisartan; Take 1 tablet (20 mg total) by mouth at bedtime.  Dispense: 90 tablet; Refill: 1 -     Ondansetron; Dissolve 1 tablet (4 mg total) by mouth every 8 (eight) hours as needed for nausea or vomiting.  Dispense: 20 tablet; Refill: 0      Follow-up: No follow-ups on file.   Sherlene Shams, MD

## 2023-10-04 NOTE — Assessment & Plan Note (Addendum)
Well controlled,  with weight loss of 40 lb   since 2022  using Ozempic and  metformin.  Continue crestor, .  continue telmisartan. Current  exam of upper abd pain and 2 week history of  RUQ pain worrisome for gallstone pancreatitis .  Lfts  lipase plain abd films followed by liver u/s vs CT ab and pelvis   Lab Results  Component Value Date   HGBA1C 4.8 07/11/2023   Lab Results  Component Value Date   MICROALBUR 1.6 12/06/2022   MICROALBUR 1.7 11/29/2021

## 2023-10-06 NOTE — Assessment & Plan Note (Signed)
LFTS and lipase are normal.  Cholelithiasis and biliary cholic suspected.  Abd ultrasound ordered

## 2023-10-06 NOTE — Assessment & Plan Note (Signed)
Thyroid was overactive today. She has no symptoms . Tsh was not done due to acute visit.  Lab Results  Component Value Date   TSH 0.39 (L) 07/17/2023

## 2023-10-08 ENCOUNTER — Other Ambulatory Visit: Payer: Self-pay

## 2023-10-09 ENCOUNTER — Other Ambulatory Visit: Payer: Self-pay

## 2023-10-11 ENCOUNTER — Other Ambulatory Visit (INDEPENDENT_AMBULATORY_CARE_PROVIDER_SITE_OTHER): Payer: Commercial Managed Care - PPO

## 2023-10-11 DIAGNOSIS — E1159 Type 2 diabetes mellitus with other circulatory complications: Secondary | ICD-10-CM | POA: Diagnosis not present

## 2023-10-11 DIAGNOSIS — R7989 Other specified abnormal findings of blood chemistry: Secondary | ICD-10-CM | POA: Diagnosis not present

## 2023-10-11 DIAGNOSIS — E669 Obesity, unspecified: Secondary | ICD-10-CM

## 2023-10-11 DIAGNOSIS — I152 Hypertension secondary to endocrine disorders: Secondary | ICD-10-CM | POA: Diagnosis not present

## 2023-10-11 DIAGNOSIS — E1169 Type 2 diabetes mellitus with other specified complication: Secondary | ICD-10-CM | POA: Diagnosis not present

## 2023-10-11 LAB — MICROALBUMIN / CREATININE URINE RATIO
Creatinine,U: 264.7 mg/dL
Microalb Creat Ratio: 0.6 mg/g (ref 0.0–30.0)
Microalb, Ur: 1.7 mg/dL (ref 0.0–1.9)

## 2023-10-12 LAB — TSH: TSH: 0.29 u[IU]/mL — ABNORMAL LOW (ref 0.35–5.50)

## 2023-10-14 ENCOUNTER — Encounter: Payer: Self-pay | Admitting: Internal Medicine

## 2023-10-14 DIAGNOSIS — R7989 Other specified abnormal findings of blood chemistry: Secondary | ICD-10-CM

## 2023-10-14 DIAGNOSIS — E038 Other specified hypothyroidism: Secondary | ICD-10-CM

## 2023-10-19 MED FILL — Semaglutide Soln Pen-inj 2 MG/DOSE (8 MG/3ML): SUBCUTANEOUS | 28 days supply | Qty: 3 | Fill #2 | Status: AC

## 2023-11-01 ENCOUNTER — Other Ambulatory Visit: Payer: Self-pay | Admitting: Internal Medicine

## 2023-11-02 ENCOUNTER — Other Ambulatory Visit: Payer: Self-pay

## 2023-11-02 MED ORDER — FREESTYLE LIBRE 3 SENSOR MISC
1.0000 | 11 refills | Status: DC
  Filled 2023-11-02: qty 2, 28d supply, fill #0
  Filled 2023-11-15: qty 2, 28d supply, fill #1
  Filled 2023-12-23: qty 2, 28d supply, fill #2
  Filled 2024-01-26: qty 2, 28d supply, fill #3
  Filled 2024-02-23: qty 2, 28d supply, fill #4
  Filled 2024-03-21: qty 2, 28d supply, fill #5
  Filled 2024-04-18: qty 2, 28d supply, fill #6
  Filled 2024-05-17: qty 2, 28d supply, fill #7

## 2023-11-06 DIAGNOSIS — R946 Abnormal results of thyroid function studies: Secondary | ICD-10-CM | POA: Diagnosis not present

## 2023-11-06 DIAGNOSIS — R7989 Other specified abnormal findings of blood chemistry: Secondary | ICD-10-CM | POA: Diagnosis not present

## 2023-11-13 ENCOUNTER — Ambulatory Visit: Payer: Commercial Managed Care - PPO | Admitting: Internal Medicine

## 2023-11-15 ENCOUNTER — Other Ambulatory Visit: Payer: Self-pay | Admitting: Internal Medicine

## 2023-11-15 DIAGNOSIS — E1169 Type 2 diabetes mellitus with other specified complication: Secondary | ICD-10-CM

## 2023-11-16 ENCOUNTER — Other Ambulatory Visit: Payer: Self-pay

## 2023-11-16 ENCOUNTER — Telehealth: Payer: Self-pay | Admitting: Internal Medicine

## 2023-11-16 DIAGNOSIS — I152 Hypertension secondary to endocrine disorders: Secondary | ICD-10-CM

## 2023-11-16 NOTE — Telephone Encounter (Signed)
Patient need lab orders.

## 2023-11-19 ENCOUNTER — Other Ambulatory Visit: Payer: Self-pay

## 2023-11-19 MED ORDER — OZEMPIC (2 MG/DOSE) 8 MG/3ML ~~LOC~~ SOPN
2.0000 mg | PEN_INJECTOR | SUBCUTANEOUS | 2 refills | Status: DC
  Filled 2023-11-19: qty 3, 28d supply, fill #0
  Filled 2023-12-23: qty 3, 28d supply, fill #1
  Filled 2024-01-26: qty 3, 28d supply, fill #2

## 2023-11-26 ENCOUNTER — Other Ambulatory Visit (INDEPENDENT_AMBULATORY_CARE_PROVIDER_SITE_OTHER): Payer: Commercial Managed Care - PPO

## 2023-11-26 DIAGNOSIS — I152 Hypertension secondary to endocrine disorders: Secondary | ICD-10-CM | POA: Diagnosis not present

## 2023-11-26 DIAGNOSIS — E669 Obesity, unspecified: Secondary | ICD-10-CM | POA: Diagnosis not present

## 2023-11-26 DIAGNOSIS — E1159 Type 2 diabetes mellitus with other circulatory complications: Secondary | ICD-10-CM | POA: Diagnosis not present

## 2023-11-26 DIAGNOSIS — E1169 Type 2 diabetes mellitus with other specified complication: Secondary | ICD-10-CM

## 2023-11-26 LAB — LIPID PANEL
Cholesterol: 140 mg/dL (ref 0–200)
HDL: 52 mg/dL (ref >39.00)
LDL Cholesterol: 67 mg/dL (ref 0–99)
NonHDL: 87.67
Total CHOL/HDL Ratio: 3
Triglycerides: 104 mg/dL (ref 0.0–149.0)
VLDL: 20.8 mg/dL (ref 0.0–40.0)

## 2023-11-26 LAB — COMPREHENSIVE METABOLIC PANEL
ALT: 16 U/L (ref 0–35)
AST: 16 U/L (ref 0–37)
Albumin: 3.8 g/dL (ref 3.5–5.2)
Alkaline Phosphatase: 66 U/L (ref 39–117)
BUN: 12 mg/dL (ref 6–23)
CO2: 28 meq/L (ref 19–32)
Calcium: 9.4 mg/dL (ref 8.4–10.5)
Chloride: 102 meq/L (ref 96–112)
Creatinine, Ser: 0.94 mg/dL (ref 0.40–1.20)
GFR: 67.35 mL/min (ref >60.00)
Glucose, Bld: 105 mg/dL — ABNORMAL HIGH (ref 70–99)
Potassium: 4.3 meq/L (ref 3.5–5.1)
Sodium: 136 meq/L (ref 135–145)
Total Bilirubin: 0.5 mg/dL (ref 0.2–1.2)
Total Protein: 6.9 g/dL (ref 6.0–8.3)

## 2023-11-26 LAB — HEMOGLOBIN A1C: Hgb A1c MFr Bld: 4.9 % (ref 4.6–6.5)

## 2023-11-28 ENCOUNTER — Other Ambulatory Visit: Payer: Self-pay

## 2023-11-29 ENCOUNTER — Ambulatory Visit: Payer: Commercial Managed Care - PPO | Admitting: Internal Medicine

## 2023-11-29 ENCOUNTER — Encounter: Payer: Self-pay | Admitting: Internal Medicine

## 2023-11-29 ENCOUNTER — Other Ambulatory Visit (HOSPITAL_COMMUNITY)
Admission: RE | Admit: 2023-11-29 | Discharge: 2023-11-29 | Disposition: A | Discharge status: Home / Self Care | Payer: Commercial Managed Care - PPO | Source: Ambulatory Visit | Attending: Internal Medicine | Admitting: Internal Medicine

## 2023-11-29 ENCOUNTER — Other Ambulatory Visit: Payer: Self-pay

## 2023-11-29 VITALS — BP 112/74 | HR 75 | Ht 64.0 in | Wt 251.4 lb

## 2023-11-29 DIAGNOSIS — E669 Obesity, unspecified: Secondary | ICD-10-CM | POA: Diagnosis not present

## 2023-11-29 DIAGNOSIS — E0590 Thyrotoxicosis, unspecified without thyrotoxic crisis or storm: Secondary | ICD-10-CM | POA: Diagnosis not present

## 2023-11-29 DIAGNOSIS — Z7984 Long term (current) use of oral hypoglycemic drugs: Secondary | ICD-10-CM

## 2023-11-29 DIAGNOSIS — Z1211 Encounter for screening for malignant neoplasm of colon: Secondary | ICD-10-CM

## 2023-11-29 DIAGNOSIS — I152 Hypertension secondary to endocrine disorders: Secondary | ICD-10-CM | POA: Diagnosis not present

## 2023-11-29 DIAGNOSIS — Z124 Encounter for screening for malignant neoplasm of cervix: Secondary | ICD-10-CM | POA: Diagnosis not present

## 2023-11-29 DIAGNOSIS — Z7985 Long-term (current) use of injectable non-insulin antidiabetic drugs: Secondary | ICD-10-CM | POA: Diagnosis not present

## 2023-11-29 DIAGNOSIS — Z0000 Encounter for general adult medical examination without abnormal findings: Secondary | ICD-10-CM | POA: Diagnosis not present

## 2023-11-29 DIAGNOSIS — E1159 Type 2 diabetes mellitus with other circulatory complications: Secondary | ICD-10-CM

## 2023-11-29 DIAGNOSIS — I10 Essential (primary) hypertension: Secondary | ICD-10-CM

## 2023-11-29 DIAGNOSIS — E1169 Type 2 diabetes mellitus with other specified complication: Secondary | ICD-10-CM

## 2023-11-29 MED ORDER — BUSPIRONE HCL 30 MG PO TABS
30.0000 mg | ORAL_TABLET | Freq: Two times a day (BID) | ORAL | 3 refills | Status: DC
  Filled 2023-11-29: qty 180, 90d supply, fill #0
  Filled 2024-02-23: qty 180, 90d supply, fill #1
  Filled 2024-05-29: qty 180, 90d supply, fill #2
  Filled 2024-08-25: qty 180, 90d supply, fill #3

## 2023-11-29 MED ORDER — ROSUVASTATIN CALCIUM 5 MG PO TABS
5.0000 mg | ORAL_TABLET | Freq: Every day | ORAL | 1 refills | Status: DC
  Filled 2023-11-29: qty 90, 90d supply, fill #0
  Filled 2024-02-23: qty 90, 90d supply, fill #1

## 2023-11-29 MED ORDER — METFORMIN HCL ER 500 MG PO TB24
500.0000 mg | ORAL_TABLET | Freq: Every day | ORAL | 1 refills | Status: DC
  Filled 2023-11-29: qty 90, 90d supply, fill #0
  Filled 2024-02-23: qty 90, 90d supply, fill #1

## 2023-11-29 NOTE — Assessment & Plan Note (Addendum)
Well controlled,  with weight loss of 41 lb   since 2022  using Ozempic and  metformin.  Continue crestor, .  continue telmisartan. During prior visit she reported  upper abd pain and 2 week history of  RUQ pain which WAS worrisome for gallstone pancreatitis .However,   Lfts  lipase and plain abd films were all normal.  Medication changes were made based on this review as follows: none.  Continue metformin and oempic  .  Advised to drink 4 ounces of milk of protein shaek before she lies down to avoid hypoglycemic events.   Lab Results  Component Value Date   HGBA1C 4.9 11/26/2023   Lab Results  Component Value Date   MICROALBUR 1.7 10/11/2023   MICROALBUR 1.6 12/06/2022

## 2023-11-29 NOTE — Assessment & Plan Note (Signed)
Well controlled on current regimen of telmisartan 20 mg daily . Renal function stable, no changes today. 

## 2023-11-29 NOTE — Patient Instructions (Signed)
Your diabetes remains under excellent control currently. You have lowered your a1c  from 6.5 to 6.0.   Please plan to return in 6 months for follow up on diabetes.   Fasting labs will be ordered so you can do them  a day or two prior to visit . Also, if you have not done so,  make sure you are seeing your eye doctor at least once a year.

## 2023-11-29 NOTE — Assessment & Plan Note (Signed)

## 2023-11-29 NOTE — Progress Notes (Signed)
Patient ID: Laura Gray, female    DOB: August 21, 1966  Age: 57 y.o. MRN: [id number]  The patient is here for annual preventive examination and management of other chronic and acute problems.   The risk factors are reflected in the social history.   The roster of all physicians providing medical care to patient - is listed in the Snapshot section of the chart.   Activities of daily living:  The patient is 100% independent in all ADLs: dressing, toileting, feeding as well as independent mobility   Home safety : The patient has smoke detectors in the home. They wear seatbelts.  There are no unsecured firearms at home. There is no violence in the home.    There is no risks for hepatitis, STDs or HIV. There is no   history of blood transfusion. They have no travel history to infectious disease endemic areas of the world.   The patient has seen their dentist in the last six month. They have seen their eye doctor in the last year. The patinet  denies slight hearing difficulty with regard to whispered voices and some television programs.  They have deferred audiologic testing in the last year.  They do not  have excessive sun exposure. Discussed the need for sun protection: hats, long sleeves and use of sunscreen if there is significant sun exposure.    Diet: the importance of a healthy diet is discussed. They do have a healthy diet.   The benefits of regular aerobic exercise were discussed. The patient  exercises  3 to 5 days per week  for  60 minutes.    Depression screen: there are no signs or vegative symptoms of depression- irritability, change in appetite, anhedonia, sadness/tearfullness.   The following portions of the patient's history were reviewed and updated as appropriate: allergies, current medications, past family history, past medical history,  past surgical history, past social history  and problem list.   Visual acuity was not assessed per patient preference since the patient has  regular follow up with an  ophthalmologist. Hearing and body mass index were assessed and reviewed.    During the course of the visit the patient was educated and counseled about appropriate screening and preventive services including : fall prevention , diabetes screening, nutrition counseling, colorectal cancer screening, and recommended immunizations.    Chief Complaint:  1) follow up on obesity hypertension and diabetes:  she is tolerating ozempic, metformin,  statin and ARB   I have downloaded and reviewed the data from patient's continuous blood glucose monitor. Patient's  sugars have been  IN RANGE   97  % OF THE TIME,   BELOW RANGE  3  % of the time.  And ABOVE RANGE 0  % OF THE TIME .  The events are occurring when asleep.    Review of Symptoms  Patient denies headache, fevers, malaise, unintentional weight loss, skin rash, eye pain, sinus congestion and sinus pain, sore throat, dysphagia,  hemoptysis , cough, dyspnea, wheezing, chest pain, palpitations, orthopnea, edema, abdominal pain, nausea, melena, diarrhea, constipation, flank pain, dysuria, hematuria, urinary  Frequency, nocturia, numbness, tingling, seizures,  Focal weakness, Loss of consciousness,  Tremor, insomnia, depression, anxiety, and suicidal ideation.    Physical Exam:  BP 112/74   Pulse 75   Ht 5\' 4"  (1.626 m)   Wt 251 lb 6.4 oz (114 kg)   LMP 07/24/2015   SpO2 97%   BMI 43.15 kg/m    Physical Exam Vitals reviewed.  Constitutional:      General: She is not in acute distress.    Appearance: She is well-developed. She is obese. She is not ill-appearing, toxic-appearing or diaphoretic.  HENT:     Head: Normocephalic.     Right Ear: Tympanic membrane, ear canal and external ear normal. There is no impacted cerumen.     Left Ear: Tympanic membrane, ear canal and external ear normal. There is no impacted cerumen.     Nose: Nose normal.     Mouth/Throat:     Mouth: Mucous membranes are moist.     Pharynx:  Oropharynx is clear.  Eyes:     General: No scleral icterus.       Right eye: No discharge.        Left eye: No discharge.     Conjunctiva/sclera: Conjunctivae normal.     Pupils: Pupils are equal, round, and reactive to light.  Neck:     Thyroid: No thyromegaly.     Vascular: No carotid bruit or JVD.  Cardiovascular:     Rate and Rhythm: Normal rate and regular rhythm.     Heart sounds: Normal heart sounds.  Pulmonary:     Effort: Pulmonary effort is normal. No respiratory distress.     Breath sounds: Normal breath sounds.  Chest:  Breasts:    Breasts are symmetrical.     Right: Normal. No swelling, inverted nipple, mass, nipple discharge, skin change or tenderness.     Left: Normal. No swelling, inverted nipple, mass, nipple discharge, skin change or tenderness.  Abdominal:     General: Bowel sounds are normal.     Palpations: Abdomen is soft. There is no mass.     Tenderness: There is no abdominal tenderness. There is no guarding or rebound.     Hernia: There is no hernia in the left inguinal area or right inguinal area.  Genitourinary:    Exam position: Lithotomy position.     Pubic Area: No rash or pubic lice.      Labia:        Right: No rash, tenderness, lesion or injury.        Left: No rash, tenderness, lesion or injury.      Vagina: Normal.     Cervix: Normal.     Uterus: Normal.      Adnexa: Right adnexa normal and left adnexa normal.  Musculoskeletal:        General: Normal range of motion.     Cervical back: Normal range of motion and neck supple.  Lymphadenopathy:     Cervical: No cervical adenopathy.     Upper Body:     Right upper body: No supraclavicular, axillary or pectoral adenopathy.     Left upper body: No supraclavicular, axillary or pectoral adenopathy.     Lower Body: No right inguinal adenopathy. No left inguinal adenopathy.  Skin:    General: Skin is warm and dry.  Neurological:     General: No focal deficit present.     Mental Status: She  is alert and oriented to person, place, and time. Mental status is at baseline.  Psychiatric:        Mood and Affect: Mood normal.        Behavior: Behavior normal.        Thought Content: Thought content normal.        Judgment: Judgment normal.     Assessment and Plan: Cervical cancer screening -     Cytology - PAP  Colon cancer screening -     Ambulatory referral to Gastroenterology  Obesity, diabetes, and hypertension syndrome (HCC) Assessment & Plan: Well controlled,  with weight loss of 41 lb   since 2022  using Ozempic and  metformin.  Continue crestor, .  continue telmisartan. During prior visit she reported  upper abd pain and 2 week history of  RUQ pain which WAS worrisome for gallstone pancreatitis .However,   Lfts  lipase and plain abd films were all normal.  Medication changes were made based on this review as follows: none.  Continue metformin and oempic  .  Advised to drink 4 ounces of milk of protein shaek before she lies down to avoid hypoglycemic events.   Lab Results  Component Value Date   HGBA1C 4.9 11/26/2023   Lab Results  Component Value Date   MICROALBUR 1.7 10/11/2023   MICROALBUR 1.6 12/06/2022      Orders: -     Hemoglobin A1c; Future -     Comprehensive metabolic panel; Future -     Lipid Panel w/reflex Direct LDL; Future  Subclinical hyperthyroidism Assessment & Plan: Repeat TSH was 0.49 at Endocrinology an dT3/T4 were normal.  Antibody studies were normal in October.  She will follow up with Endocrine at 6 months for repeat    Primary hypertension Assessment & Plan: Well controlled on current regimen of telmisartan 20 mg daily . Renal function stable, no changes today.   Encounter for preventive health examination Assessment & Plan: age appropriate education and counseling updated, referrals for preventative services and immunizations addressed, dietary and smoking counseling addressed, most recent labs reviewed.  I have personally  reviewed and have noted:   1) the patient's medical and social history 2) The pt's use of alcohol, tobacco, and illicit drugs 3) The patient's current medications and supplements 4) Functional ability including ADL's, fall risk, home safety risk, hearing and visual impairment 5) Diet and physical activities 6) Evidence for depression or mood disorder 7) The patient's height, weight, and BMI have been recorded in the chart    I have made referrals, and provided counseling and education based on review of the above    Other orders -     busPIRone HCl; Take 1 tablet (30 mg total) by mouth 2 (two) times daily.  Dispense: 180 tablet; Refill: 3 -     metFORMIN HCl ER; Take 1 tablet (500 mg total) by mouth daily with breakfast.  Dispense: 90 tablet; Refill: 1 -     Rosuvastatin Calcium; Take 1 tablet (5 mg total) by mouth daily.  Dispense: 90 tablet; Refill: 1    No follow-ups on file.  Sherlene Shams, MD

## 2023-11-29 NOTE — Assessment & Plan Note (Signed)
Repeat TSH was 0.49 at Endocrinology an dT3/T4 were normal.  Antibody studies were normal in October.  She will follow up with Endocrine at 6 months for repeat

## 2023-11-30 LAB — CYTOLOGY - PAP
Adequacy: ABSENT
Comment: NEGATIVE
Diagnosis: NEGATIVE
High risk HPV: NEGATIVE

## 2023-12-03 ENCOUNTER — Other Ambulatory Visit: Payer: Self-pay

## 2023-12-03 ENCOUNTER — Telehealth: Payer: Self-pay

## 2023-12-03 DIAGNOSIS — Z860100 Personal history of colon polyps, unspecified: Secondary | ICD-10-CM

## 2023-12-03 MED ORDER — NA SULFATE-K SULFATE-MG SULF 17.5-3.13-1.6 GM/177ML PO SOLN
1.0000 | Freq: Once | ORAL | 0 refills | Status: AC
  Filled 2023-12-03: qty 354, 1d supply, fill #0

## 2023-12-03 NOTE — Telephone Encounter (Signed)
Gastroenterology Pre-Procedure Review  Request Date: 01/09/24 Requesting Physician: Dr. Servando Snare  PATIENT REVIEW QUESTIONS: The patient responded to the following health history questions as indicated:    1. Are you having any GI issues? no 2. Do you have a personal history of Polyps? yes (Last colonoscopy performed by Dr. Servando Snare 04/02/2018 recommended repeat in 3 years) 3. Do you have a family history of Colon Cancer or Polyps? no 4. Diabetes Mellitus? yes (has been advised and noted on instructions stop ozempic (7) days prior to colonoscopy stop (2) days before for metformin ) 5. Joint replacements in the past 12 months?no 6. Major health problems in the past 3 months?no 7. Any artificial heart valves, MVP, or defibrillator?no    MEDICATIONS & ALLERGIES:    Patient reports the following regarding taking any anticoagulation/antiplatelet therapy:   Plavix, Coumadin, Eliquis, Xarelto, Lovenox, Pradaxa, Brilinta, or Effient? no Aspirin? no  Patient confirms/reports the following medications:  Current Outpatient Medications  Medication Sig Dispense Refill   Na Sulfate-K Sulfate-Mg Sulf 17.5-3.13-1.6 GM/177ML SOLN Take 1 kit by mouth once for 1 dose. 354 mL 0   busPIRone (BUSPAR) 30 MG tablet Take 1 tablet (30 mg total) by mouth 2 (two) times daily. 180 tablet 3   cetirizine (ZYRTEC) 10 MG tablet Take 10 mg by mouth as needed.     Continuous Blood Gluc Receiver (FREESTYLE LIBRE 3 READER) DEVI 1 Device by Does not apply route every 14 (fourteen) days. TO CHECK SUGARS 6 TIMES DAILY (PRE AND 2 HR POST MEALS) 2 each 11   Continuous Glucose Sensor (FREESTYLE LIBRE 3 SENSOR) MISC Place 1 sensor on the skin every 14 days. Use to check glucose continuously 2 each 11   FLUoxetine (PROZAC) 40 MG capsule Take 1 capsule (40 mg total) by mouth daily. 90 capsule 1   Melatonin 1 MG CAPS Take 1 capsule by mouth at bedtime as needed.     metFORMIN (GLUCOPHAGE-XR) 500 MG 24 hr tablet Take 1 tablet (500 mg total)  by mouth daily with breakfast. 90 tablet 1   ondansetron (ZOFRAN-ODT) 4 MG disintegrating tablet Dissolve 1 tablet (4 mg total) by mouth every 8 (eight) hours as needed for nausea or vomiting. 20 tablet 0   rosuvastatin (CRESTOR) 5 MG tablet Take 1 tablet (5 mg total) by mouth daily. 90 tablet 1   Semaglutide, 2 MG/DOSE, (OZEMPIC, 2 MG/DOSE,) 8 MG/3ML SOPN Inject 2 mg into the skin once a week. 3 mL 2   telmisartan (MICARDIS) 20 MG tablet Take 1 tablet (20 mg total) by mouth at bedtime. 90 tablet 1   No current facility-administered medications for this visit.    Patient confirms/reports the following allergies:  Allergies  Allergen Reactions   Codeine    Ultram [Tramadol Hcl]     No orders of the defined types were placed in this encounter.   AUTHORIZATION INFORMATION Primary Insurance: 1D#: Group #:  Secondary Insurance: 1D#: Group #:  SCHEDULE INFORMATION: Date: 01/09/24 Time: Location: [facility]

## 2023-12-23 ENCOUNTER — Other Ambulatory Visit: Payer: Self-pay

## 2023-12-24 ENCOUNTER — Other Ambulatory Visit: Payer: Self-pay

## 2024-01-02 ENCOUNTER — Encounter: Payer: Self-pay | Admitting: Gastroenterology

## 2024-01-09 ENCOUNTER — Other Ambulatory Visit: Payer: Self-pay

## 2024-01-09 ENCOUNTER — Ambulatory Visit
Admission: RE | Admit: 2024-01-09 | Discharge: 2024-01-09 | Disposition: A | Discharge status: Home / Self Care | Payer: Commercial Managed Care - PPO | Attending: Gastroenterology | Admitting: Gastroenterology

## 2024-01-09 ENCOUNTER — Encounter
Admission: RE | Disposition: A | Discharge status: Home / Self Care | Payer: Self-pay | Source: Home / Self Care | Attending: Gastroenterology

## 2024-01-09 ENCOUNTER — Ambulatory Visit: Payer: Commercial Managed Care - PPO | Admitting: Certified Registered"

## 2024-01-09 ENCOUNTER — Encounter: Payer: Self-pay | Admitting: Gastroenterology

## 2024-01-09 DIAGNOSIS — E119 Type 2 diabetes mellitus without complications: Secondary | ICD-10-CM | POA: Insufficient documentation

## 2024-01-09 DIAGNOSIS — Z7984 Long term (current) use of oral hypoglycemic drugs: Secondary | ICD-10-CM | POA: Insufficient documentation

## 2024-01-09 DIAGNOSIS — E66813 Obesity, class 3: Secondary | ICD-10-CM | POA: Diagnosis not present

## 2024-01-09 DIAGNOSIS — Z6841 Body Mass Index (BMI) 40.0 and over, adult: Secondary | ICD-10-CM | POA: Insufficient documentation

## 2024-01-09 DIAGNOSIS — D120 Benign neoplasm of cecum: Secondary | ICD-10-CM | POA: Diagnosis not present

## 2024-01-09 DIAGNOSIS — Z860100 Personal history of colon polyps, unspecified: Secondary | ICD-10-CM | POA: Diagnosis not present

## 2024-01-09 DIAGNOSIS — Z79899 Other long term (current) drug therapy: Secondary | ICD-10-CM | POA: Insufficient documentation

## 2024-01-09 DIAGNOSIS — F419 Anxiety disorder, unspecified: Secondary | ICD-10-CM | POA: Insufficient documentation

## 2024-01-09 DIAGNOSIS — Z1211 Encounter for screening for malignant neoplasm of colon: Secondary | ICD-10-CM | POA: Diagnosis not present

## 2024-01-09 DIAGNOSIS — F32A Depression, unspecified: Secondary | ICD-10-CM | POA: Insufficient documentation

## 2024-01-09 DIAGNOSIS — I10 Essential (primary) hypertension: Secondary | ICD-10-CM | POA: Insufficient documentation

## 2024-01-09 DIAGNOSIS — Z860101 Personal history of adenomatous and serrated colon polyps: Secondary | ICD-10-CM | POA: Diagnosis not present

## 2024-01-09 HISTORY — PX: COLONOSCOPY WITH PROPOFOL: SHX5780

## 2024-01-09 HISTORY — DX: Type 2 diabetes mellitus without complications: E11.9

## 2024-01-09 LAB — GLUCOSE, CAPILLARY: Glucose-Capillary: 105 mg/dL — ABNORMAL HIGH (ref 70–99)

## 2024-01-09 SURGERY — COLONOSCOPY WITH PROPOFOL
Anesthesia: General

## 2024-01-09 MED ORDER — NA SULFATE-K SULFATE-MG SULF 17.5-3.13-1.6 GM/177ML PO SOLN
1.0000 | Freq: Once | ORAL | 0 refills | Status: AC
  Filled 2024-01-09: qty 354, 1d supply, fill #0

## 2024-01-09 MED ORDER — LIDOCAINE HCL (CARDIAC) PF 100 MG/5ML IV SOSY
PREFILLED_SYRINGE | INTRAVENOUS | Status: DC | PRN
  Administered 2024-01-09: 100 mg via INTRAVENOUS

## 2024-01-09 MED ORDER — SODIUM CHLORIDE 0.9 % IV SOLN
INTRAVENOUS | Status: DC
  Administered 2024-01-09: 1000 mL via INTRAVENOUS

## 2024-01-09 MED ORDER — PHENYLEPHRINE 80 MCG/ML (10ML) SYRINGE FOR IV PUSH (FOR BLOOD PRESSURE SUPPORT)
PREFILLED_SYRINGE | INTRAVENOUS | Status: DC | PRN
  Administered 2024-01-09: 80 ug via INTRAVENOUS

## 2024-01-09 MED ORDER — LIDOCAINE HCL (PF) 2 % IJ SOLN
INTRAMUSCULAR | Status: AC
  Filled 2024-01-09: qty 5

## 2024-01-09 MED ORDER — PROPOFOL 10 MG/ML IV BOLUS
INTRAVENOUS | Status: AC
  Filled 2024-01-09: qty 20

## 2024-01-09 MED ORDER — PROPOFOL 500 MG/50ML IV EMUL
INTRAVENOUS | Status: DC | PRN
  Administered 2024-01-09: 110 ug/kg/min via INTRAVENOUS
  Administered 2024-01-09: 100 mg via INTRAVENOUS

## 2024-01-09 NOTE — Transfer of Care (Signed)
 Immediate Anesthesia Transfer of Care Note  Patient: Laura Gray  Procedure(s) Performed: COLONOSCOPY WITH PROPOFOL   Patient Location: Endoscopy Unit  Anesthesia Type:General  Level of Consciousness: awake, alert , and oriented  Airway & Oxygen Therapy: Patient Spontanous Breathing  Post-op Assessment: Report given to RN and Post -op Vital signs reviewed and stable  Post vital signs: Reviewed and stable  Last Vitals:  Vitals Value Taken Time  BP 107/69 0837  Temp 35.8 0837  Pulse 72 0837  Resp 18 0837  SpO2 99 0837    Last Pain:  Vitals:   01/09/24 0802  TempSrc: Temporal  PainSc: 0-No pain         Complications: No notable events documented.

## 2024-01-09 NOTE — Anesthesia Postprocedure Evaluation (Signed)
 Anesthesia Post Note  Patient: Laura Gray  Procedure(s) Performed: COLONOSCOPY WITH PROPOFOL   Patient location during evaluation: Endoscopy Anesthesia Type: General Level of consciousness: awake and alert Pain management: pain level controlled Vital Signs Assessment: post-procedure vital signs reviewed and stable Respiratory status: spontaneous breathing, nonlabored ventilation, respiratory function stable and patient connected to nasal cannula oxygen Cardiovascular status: blood pressure returned to baseline and stable Postop Assessment: no apparent nausea or vomiting Anesthetic complications: no   No notable events documented.   Last Vitals:  Vitals:   01/09/24 0847 01/09/24 0857  BP: 121/73 128/76  Pulse: 68   Resp: 15 13  Temp:    SpO2: 98%     Last Pain:  Vitals:   01/09/24 0847  TempSrc:   PainSc: 0-No pain                 Nancey Awkward

## 2024-01-09 NOTE — Op Note (Signed)
 [facility] Gastroenterology Patient Name: Laura Gray Procedure Date: 01/09/2024 8:20 AM MRN: [id number] Account #: [id number] Date of Birth: May 04, 1966 Admit Type: Outpatient Age: 58 Room: [facility] ENDO ROOM 4 Gender: Female Note Status: Finalized Instrument Name: Charlyn Cooley [id number] Procedure:             Colonoscopy Indications:           High risk colon cancer surveillance: Personal history                         of colonic polyps Providers:             [name] MD, MD Referring MD:          Creta Dolin, MD (Referring MD) Medicines:             Propofol  per Anesthesia Complications:         No immediate complications. Procedure:             Pre-Anesthesia Assessment:                        - Prior to the procedure, a History and Physical was                         performed, and patient medications and allergies were                         reviewed. The patient's tolerance of previous                         anesthesia was also reviewed. The risks and benefits                         of the procedure and the sedation options and risks                         were discussed with the patient. All questions were                         answered, and informed consent was obtained. Prior                         Anticoagulants: The patient has taken no anticoagulant                         or antiplatelet agents. ASA Grade Assessment: II - A                         patient with mild systemic disease. After reviewing                         the risks and benefits, the patient was deemed in                         satisfactory condition to undergo the procedure.                        After obtaining informed consent, the colonoscope was  passed under direct vision. Throughout the procedure,                         the patient's blood pressure, pulse, and oxygen                         saturations were monitored continuously. The                          Colonoscope was introduced through the anus with the                         intention of advancing to the cecum. The scope was                         advanced to the transverse colon before the procedure                         was aborted. Medications were given. The colonoscopy                         was performed without difficulty. The patient                         tolerated the procedure well. The quality of the bowel                         preparation was not adequate to identify polyps                         greater than 5 mm in size. Findings:      The perianal and digital rectal examinations were normal.      A large amount of stool was found in the entire colon, precluding       visualization. Impression:            - Preparation of the colon was inadequate.                        - Stool in the entire examined colon.                        - No specimens collected. Recommendation:        - Discharge patient to home.                        - Resume previous diet.                        - Continue present medications.                        - Repeat colonoscopy because the bowel preparation was                         poor. Procedure Code(s):     --- Professional ---                        [phone], 53, Colonoscopy, flexible; diagnostic,  including collection of specimen(s) by brushing or                         washing, when performed (separate procedure) Diagnosis Code(s):     --- Professional ---                        Z86.010, Personal history of colonic polyps CPT copyright 2022 American Medical Association. All rights reserved. The codes documented in this report are preliminary and upon coder review may  be revised to meet current compliance requirements. [name] MD, MD 01/09/2024 8:33:19 AM This report has been signed electronically. Number of Addenda: 0 Note Initiated On: 01/09/2024 8:20 AM Total Procedure Duration: 0  hours 5 minutes 7 seconds  Estimated Blood Loss:  Estimated blood loss: none.      [facility]

## 2024-01-09 NOTE — H&P (Signed)
 Laura Sink, MD [facility] [address], [address] [place], [place] [address] [phone] Fax : [phone]  Primary Care Physician:  Laura Flax, MD Primary Gastroenterologist:  Dr. Ole Berkeley  Pre-Procedure History & Physical: HPI:  Laura Gray is a 58 y.o. female is here for an colonoscopy.   Past Medical History:  Diagnosis Date   Depression    Diabetes mellitus without complication (HCC)    Hypertension     Past Surgical History:  Procedure Laterality Date   COLONOSCOPY WITH PROPOFOL  N/A 04/02/2018   Procedure: COLONOSCOPY WITH PROPOFOL ;  Surgeon: Laura Sink, MD;  Location: [facility] ENDOSCOPY;  Service: Endoscopy;  Laterality: N/A;   DIAGNOSTIC LAPAROSCOPY     DILATION AND CURETTAGE OF UTERUS  12/26/1987   miscarriage   exploratory  12/25/1998   x 2, ectopic pregnancy x 2, one rupture    Prior to Admission medications   Medication Sig Start Date End Date Taking? Authorizing Provider  busPIRone  (BUSPAR ) 30 MG tablet Take 1 tablet (30 mg total) by mouth 2 (two) times daily. 11/29/23 11/28/24 Yes Laura Flax, MD  cetirizine (ZYRTEC) 10 MG tablet Take 10 mg by mouth as needed.   Yes [provider]  Continuous Glucose Sensor (FREESTYLE LIBRE 3 SENSOR) MISC Place 1 sensor on the skin every 14 days. Use to check glucose continuously 11/02/23  Yes Laura Flax, MD  FLUoxetine  (PROZAC ) 40 MG capsule Take 1 capsule (40 mg total) by mouth daily. 10/04/23 10/03/24 Yes Laura Flax, MD  Melatonin 1 MG CAPS Take 1 capsule by mouth at bedtime as needed.   Yes [provider]  metFORMIN  (GLUCOPHAGE -XR) 500 MG 24 hr tablet Take 1 tablet (500 mg total) by mouth daily with breakfast. 11/29/23  Yes Laura Flax, MD  rosuvastatin  (CRESTOR ) 5 MG tablet Take 1 tablet (5 mg total) by mouth daily. 11/29/23  Yes Laura Flax, MD  telmisartan  (MICARDIS ) 20 MG tablet Take 1 tablet (20 mg total) by mouth at bedtime. 10/04/23 10/03/24 Yes Laura Flax, MD   Continuous Blood Gluc Receiver (FREESTYLE LIBRE 3 READER) DEVI 1 Device by Does not apply route every 14 (fourteen) days. TO CHECK SUGARS 6 TIMES DAILY (PRE AND 2 HR POST MEALS) 12/14/22   Laura Flax, MD  ondansetron  (ZOFRAN -ODT) 4 MG disintegrating tablet Dissolve 1 tablet (4 mg total) by mouth every 8 (eight) hours as needed for nausea or vomiting. 10/04/23   Laura Flax, MD  Semaglutide , 2 MG/DOSE, (OZEMPIC , 2 MG/DOSE,) 8 MG/3ML SOPN Inject 2 mg into the skin once a week. 11/19/23   Laura Flax, MD    Allergies as of 12/03/2023 - Review Complete 11/29/2023  Allergen Reaction Noted   Codeine  03/27/2012   Ultram [tramadol hcl]  03/27/2012    Family History  Problem Relation Age of Onset   Thyroid  disease Mother    Hypertension Mother    Hyperlipidemia Father    Thyroid  disease Maternal Grandmother    Cancer Maternal Grandmother        BREAST, NOW CANCER FREE   Dementia Maternal Grandmother    Breast cancer Maternal Grandmother        Late 60's   Cancer Maternal Grandfather        LUNG   Hypertension Maternal Grandfather        AORTIC ANEURYSM REPAIR   Cancer Paternal Grandfather        LUNG    Cancer Paternal Aunt  BLADDER   Cancer Paternal Uncle        PANCREATIC    Social History   Socioeconomic History   Marital status: Divorced    Spouse name: Not on file   Number of children: Not on file   Years of education: Not on file   Highest education level: Associate degree: occupational, Scientist, product/process development, or vocational program  Occupational History   Not on file  Tobacco Use   Smoking status: Never   Smokeless tobacco: Never  Vaping Use   Vaping status: Never Used  Substance and Sexual Activity   Alcohol use: No   Drug use: No   Sexual activity: Yes    Birth control/protection: Condom  Other Topics Concern   Not on file  Social History Narrative   Not on file   Social Drivers of Health   Financial Resource Strain: Low Risk  (11/27/2023)    Overall Financial Resource Strain (CARDIA)    Difficulty of Paying Living Expenses: Not hard at all  Food Insecurity: No Food Insecurity (11/27/2023)   Hunger Vital Sign    Worried About Running Out of Food in the Last Year: Never true    Ran Out of Food in the Last Year: Never true  Transportation Needs: No Transportation Needs (11/27/2023)   PRAPARE - Administrator, Civil Service (Medical): No    Lack of Transportation (Non-Medical): No  Physical Activity: Insufficiently Active (11/27/2023)   Exercise Vital Sign    Days of Exercise per Week: 2 days    Minutes of Exercise per Session: 20 min  Stress: No Stress Concern Present (11/27/2023)   [employer] of Occupational Health - Occupational Stress Questionnaire    Feeling of Stress : Only a little  Recent Concern: Stress - Stress Concern Present (10/02/2023)   [employer] of Occupational Health - Occupational Stress Questionnaire    Feeling of Stress : To some extent  Social Connections: Socially Isolated (11/27/2023)   Social Connection and Isolation Panel [NHANES]    Frequency of Communication with Friends and Family: More than three times a week    Frequency of Social Gatherings with Friends and Family: Twice a week    Attends Religious Services: Never    Database administrator or Organizations: No    Attends Engineer, structural: Not on file    Marital Status: Divorced  Catering manager Violence: Not on file    Review of Systems: See HPI, otherwise negative ROS  Physical Exam: BP 136/66   Pulse 65   Temp (!) 97 F (36.1 C) (Temporal)   Resp 18   Ht 5' 4.5" (1.638 m)   Wt 114.6 kg   LMP 07/24/2015   SpO2 99%   BMI 42.71 kg/m  General:   Alert,  pleasant and cooperative in NAD Head:  Normocephalic and atraumatic. Neck:  Supple; no masses or thyromegaly. Lungs:  Clear throughout to auscultation.    Heart:  Regular rate and rhythm. Abdomen:  Soft, nontender and nondistended. Normal bowel  sounds, without guarding, and without rebound.   Neurologic:  Alert and  oriented x4;  grossly normal neurologically.  Impression/Plan: Laura Gray is here for an colonoscopy to be performed for a history of adenomatous polyps on 2019   Risks, benefits, limitations, and alternatives regarding  colonoscopy have been reviewed with the patient.  Questions have been answered.  All parties agreeable.   Laura Sink, MD  01/09/2024, 8:18 AM

## 2024-01-09 NOTE — Progress Notes (Signed)
 Rescheduled for repeat colonoscopy tomorrow. She was instructed on clear liquid diet today and pick up prep at pharmacy.

## 2024-01-09 NOTE — Anesthesia Preprocedure Evaluation (Signed)
 Anesthesia Evaluation  Patient identified by MRN, date of birth, ID band Patient awake    Reviewed: Allergy & Precautions, NPO status , Patient's Chart, lab work & pertinent test results  History of Anesthesia Complications Negative for: history of anesthetic complications  Airway Mallampati: III  TM Distance: >3 FB Neck ROM: full    Dental no notable dental hx.    Pulmonary neg pulmonary ROS   Pulmonary exam normal        Cardiovascular hypertension, On Medications negative cardio ROS Normal cardiovascular exam     Neuro/Psych  PSYCHIATRIC DISORDERS Anxiety Depression    negative neurological ROS     GI/Hepatic negative GI ROS, Neg liver ROS,,,  Endo/Other  diabetes  Class 3 obesity  Renal/GU negative Renal ROS  negative genitourinary   Musculoskeletal   Abdominal   Peds  Hematology negative hematology ROS (+)   Anesthesia Other Findings Past Medical History: No date: Depression No date: Diabetes mellitus without complication (HCC) No date: Hypertension  Past Surgical History: 04/02/2018: COLONOSCOPY WITH PROPOFOL ; N/A     Comment:  Procedure: COLONOSCOPY WITH PROPOFOL ;  Surgeon: [name], MD;  Location: [facility] ENDOSCOPY;  Service:               Endoscopy;  Laterality: N/A; No date: DIAGNOSTIC LAPAROSCOPY 12/26/1987: DILATION AND CURETTAGE OF UTERUS     Comment:  miscarriage 12/25/1998: exploratory     Comment:  x 2, ectopic pregnancy x 2, one rupture  BMI    Body Mass Index: 42.71 kg/m      Reproductive/Obstetrics negative OB ROS                             Anesthesia Physical Anesthesia Plan  ASA: 3  Anesthesia Plan: General   Post-op Pain Management:    Induction: Intravenous  PONV Risk Score and Plan: Propofol  infusion and TIVA  Airway Management Planned: Natural Airway and Nasal Cannula  Additional Equipment:   Intra-op Plan:    Post-operative Plan:   Informed Consent: I have reviewed the patients History and Physical, chart, labs and discussed the procedure including the risks, benefits and alternatives for the proposed anesthesia with the patient or authorized representative who has indicated his/her understanding and acceptance.     Dental Advisory Given  Plan Discussed with: Anesthesiologist, CRNA and Surgeon  Anesthesia Plan Comments: (Patient consented for risks of anesthesia including but not limited to:  - adverse reactions to medications - risk of airway placement if required - damage to eyes, teeth, lips or other oral mucosa - nerve damage due to positioning  - sore throat or hoarseness - Damage to heart, brain, nerves, lungs, other parts of body or loss of life  Patient voiced understanding and assent.)       Anesthesia Quick Evaluation

## 2024-01-09 NOTE — Brief Op Note (Signed)
 Poor prep. Colonoscopy aborted at transverse colon

## 2024-01-10 ENCOUNTER — Encounter
Admission: RE | Disposition: A | Discharge status: Home / Self Care | Payer: Self-pay | Source: Home / Self Care | Attending: Gastroenterology

## 2024-01-10 ENCOUNTER — Ambulatory Visit: Payer: Commercial Managed Care - PPO | Admitting: Certified Registered Nurse Anesthetist

## 2024-01-10 ENCOUNTER — Other Ambulatory Visit: Payer: Self-pay

## 2024-01-10 ENCOUNTER — Encounter: Payer: Self-pay | Admitting: Gastroenterology

## 2024-01-10 ENCOUNTER — Ambulatory Visit
Admission: RE | Admit: 2024-01-10 | Discharge: 2024-01-10 | Disposition: A | Discharge status: Home / Self Care | Payer: Commercial Managed Care - PPO | Attending: Gastroenterology | Admitting: Gastroenterology

## 2024-01-10 DIAGNOSIS — Z6841 Body Mass Index (BMI) 40.0 and over, adult: Secondary | ICD-10-CM | POA: Diagnosis not present

## 2024-01-10 DIAGNOSIS — I10 Essential (primary) hypertension: Secondary | ICD-10-CM | POA: Diagnosis not present

## 2024-01-10 DIAGNOSIS — Z1211 Encounter for screening for malignant neoplasm of colon: Secondary | ICD-10-CM | POA: Diagnosis not present

## 2024-01-10 DIAGNOSIS — Z860101 Personal history of adenomatous and serrated colon polyps: Secondary | ICD-10-CM

## 2024-01-10 DIAGNOSIS — Z79899 Other long term (current) drug therapy: Secondary | ICD-10-CM | POA: Diagnosis not present

## 2024-01-10 DIAGNOSIS — Z860100 Personal history of colon polyps, unspecified: Secondary | ICD-10-CM

## 2024-01-10 DIAGNOSIS — D120 Benign neoplasm of cecum: Secondary | ICD-10-CM | POA: Diagnosis not present

## 2024-01-10 DIAGNOSIS — Z7984 Long term (current) use of oral hypoglycemic drugs: Secondary | ICD-10-CM | POA: Insufficient documentation

## 2024-01-10 DIAGNOSIS — F419 Anxiety disorder, unspecified: Secondary | ICD-10-CM | POA: Diagnosis not present

## 2024-01-10 DIAGNOSIS — E119 Type 2 diabetes mellitus without complications: Secondary | ICD-10-CM | POA: Insufficient documentation

## 2024-01-10 DIAGNOSIS — K635 Polyp of colon: Secondary | ICD-10-CM | POA: Diagnosis not present

## 2024-01-10 DIAGNOSIS — F32A Depression, unspecified: Secondary | ICD-10-CM | POA: Diagnosis not present

## 2024-01-10 DIAGNOSIS — E66813 Obesity, class 3: Secondary | ICD-10-CM | POA: Diagnosis not present

## 2024-01-10 HISTORY — PX: COLONOSCOPY: SHX5424

## 2024-01-10 HISTORY — PX: POLYPECTOMY: SHX5525

## 2024-01-10 SURGERY — COLONOSCOPY
Anesthesia: General

## 2024-01-10 MED ORDER — SODIUM CHLORIDE 0.9 % IV SOLN: INTRAVENOUS | Status: DC

## 2024-01-10 MED ORDER — PROPOFOL 500 MG/50ML IV EMUL
INTRAVENOUS | Status: DC | PRN
  Administered 2024-01-10: 140 ug/kg/min via INTRAVENOUS

## 2024-01-10 MED ORDER — PROPOFOL 10 MG/ML IV BOLUS
INTRAVENOUS | Status: DC | PRN
  Administered 2024-01-10: 60 mg via INTRAVENOUS

## 2024-01-10 NOTE — Anesthesia Postprocedure Evaluation (Signed)
Anesthesia Post Note  Patient: Laura Gray  Procedure(s) Performed: COLONOSCOPY POLYPECTOMY  Patient location during evaluation: PACU Anesthesia Type: General Level of consciousness: awake and alert Pain management: pain level controlled Vital Signs Assessment: post-procedure vital signs reviewed and stable Respiratory status: spontaneous breathing, nonlabored ventilation and respiratory function stable Cardiovascular status: blood pressure returned to baseline and stable Postop Assessment: no apparent nausea or vomiting Anesthetic complications: no   No notable events documented.   Last Vitals:  Vitals:   01/10/24 1022 01/10/24 1105  BP: 122/81 93/62  Pulse: 68 75  Resp: 14 15  Temp: (!) 36.3 C 36.5 C  SpO2: 100% 100%    Last Pain:  Vitals:   01/10/24 1115  TempSrc:   PainSc: 0-No pain                 Foye Deer

## 2024-01-10 NOTE — Anesthesia Preprocedure Evaluation (Addendum)
Anesthesia Evaluation  Patient identified by MRN, date of birth, ID band Patient awake    Reviewed: Allergy & Precautions, NPO status , Patient's Chart, lab work & pertinent test results  History of Anesthesia Complications Negative for: history of anesthetic complications  Airway Mallampati: III  TM Distance: >3 FB Neck ROM: full    Dental no notable dental hx.    Pulmonary neg pulmonary ROS   Pulmonary exam normal        Cardiovascular hypertension, On Medications Normal cardiovascular exam     Neuro/Psych  PSYCHIATRIC DISORDERS Anxiety Depression    negative neurological ROS     GI/Hepatic negative GI ROS, Neg liver ROS,,,  Endo/Other  diabetes  Class 3 obesity  Renal/GU negative Renal ROS  negative genitourinary   Musculoskeletal   Abdominal  (+) + obese  Peds  Hematology negative hematology ROS (+)   Anesthesia Other Findings Past Medical History: No date: Depression No date: Diabetes mellitus without complication (HCC) No date: Hypertension  Past Surgical History: 04/02/2018: COLONOSCOPY WITH PROPOFOL; N/A     Comment:  Procedure: COLONOSCOPY WITH PROPOFOL;  Surgeon: Midge Minium, MD;  Location: [facility] ENDOSCOPY;  Service:               Endoscopy;  Laterality: N/A; No date: DIAGNOSTIC LAPAROSCOPY 12/26/1987: DILATION AND CURETTAGE OF UTERUS     Comment:  miscarriage 12/25/1998: exploratory     Comment:  x 2, ectopic pregnancy x 2, one rupture  BMI    Body Mass Index: 42.71 kg/m      Reproductive/Obstetrics negative OB ROS                             Anesthesia Physical Anesthesia Plan  ASA: 3  Anesthesia Plan: General   Post-op Pain Management:    Induction: Intravenous  PONV Risk Score and Plan: Propofol infusion and TIVA  Airway Management Planned: Natural Airway and Nasal Cannula  Additional Equipment:   Intra-op Plan:   Post-operative  Plan:   Informed Consent: I have reviewed the patients History and Physical, chart, labs and discussed the procedure including the risks, benefits and alternatives for the proposed anesthesia with the patient or authorized representative who has indicated his/her understanding and acceptance.     Dental Advisory Given  Plan Discussed with: Anesthesiologist, CRNA and Surgeon  Anesthesia Plan Comments:        Anesthesia Quick Evaluation

## 2024-01-10 NOTE — Anesthesia Procedure Notes (Signed)
Date/Time: 01/10/2024 10:45 AM  Performed by: Malva Cogan, CRNAPre-anesthesia Checklist: Patient identified, Emergency Drugs available, Suction available, Patient being monitored and Timeout performed Patient Re-evaluated:Patient Re-evaluated prior to induction Oxygen Delivery Method: Nasal cannula Induction Type: IV induction Placement Confirmation: CO2 detector and positive ETCO2

## 2024-01-10 NOTE — H&P (Signed)
Midge Minium, MD [facility] [address], [address] [place], [place] [address] Phone:[phone] Fax : [phone]  Primary Care Physician:  Sherlene Shams, MD Primary Gastroenterologist:  Dr. Servando Snare  Pre-Procedure History & Physical: HPI:  Laura Gray is a 58 y.o. female is here for an colonoscopy.   Past Medical History:  Diagnosis Date   Depression    Diabetes mellitus without complication (HCC)    Hypertension     Past Surgical History:  Procedure Laterality Date   COLONOSCOPY WITH PROPOFOL N/A 04/02/2018   Procedure: COLONOSCOPY WITH PROPOFOL;  Surgeon: Midge Minium, MD;  Location: [facility] ENDOSCOPY;  Service: Endoscopy;  Laterality: N/A;   COLONOSCOPY WITH PROPOFOL N/A 01/09/2024   Procedure: COLONOSCOPY WITH PROPOFOL;  Surgeon: Midge Minium, MD;  Location: [facility] ENDOSCOPY;  Service: Endoscopy;  Laterality: N/A;   DIAGNOSTIC LAPAROSCOPY     DILATION AND CURETTAGE OF UTERUS  12/26/1987   miscarriage   exploratory  12/25/1998   x 2, ectopic pregnancy x 2, one rupture    Prior to Admission medications   Medication Sig Start Date End Date Taking? Authorizing Provider  busPIRone (BUSPAR) 30 MG tablet Take 1 tablet (30 mg total) by mouth 2 (two) times daily. 11/29/23 11/28/24 Yes Sherlene Shams, MD  FLUoxetine (PROZAC) 40 MG capsule Take 1 capsule (40 mg total) by mouth daily. 10/04/23 10/03/24 Yes Sherlene Shams, MD  rosuvastatin (CRESTOR) 5 MG tablet Take 1 tablet (5 mg total) by mouth daily. 11/29/23  Yes Sherlene Shams, MD  telmisartan (MICARDIS) 20 MG tablet Take 1 tablet (20 mg total) by mouth at bedtime. 10/04/23 10/03/24 Yes Sherlene Shams, MD  cetirizine (ZYRTEC) 10 MG tablet Take 10 mg by mouth as needed.    [provider]  Continuous Blood Gluc Receiver (FREESTYLE LIBRE 3 READER) DEVI 1 Device by Does not apply route every 14 (fourteen) days. TO CHECK SUGARS 6 TIMES DAILY (PRE AND 2 HR POST MEALS) 12/14/22   Sherlene Shams, MD  Continuous Glucose Sensor  (FREESTYLE LIBRE 3 SENSOR) MISC Place 1 sensor on the skin every 14 days. Use to check glucose continuously 11/02/23   Sherlene Shams, MD  Melatonin 1 MG CAPS Take 1 capsule by mouth at bedtime as needed.    [provider]  metFORMIN (GLUCOPHAGE-XR) 500 MG 24 hr tablet Take 1 tablet (500 mg total) by mouth daily with breakfast. 11/29/23   Sherlene Shams, MD  Na Sulfate-K Sulfate-Mg Sulfate concentrate 17.5-3.13-1.6 GM/177ML SOLN Take 1 kit by mouth once for 1 dose. 01/09/24 01/10/24  Midge Minium, MD  ondansetron (ZOFRAN-ODT) 4 MG disintegrating tablet Dissolve 1 tablet (4 mg total) by mouth every 8 (eight) hours as needed for nausea or vomiting. 10/04/23   Sherlene Shams, MD  Semaglutide, 2 MG/DOSE, (OZEMPIC, 2 MG/DOSE,) 8 MG/3ML SOPN Inject 2 mg into the skin once a week. 11/19/23   Sherlene Shams, MD    Allergies as of 01/09/2024 - Review Complete 01/09/2024  Allergen Reaction Noted   Codeine  03/27/2012   Ultram [tramadol hcl]  03/27/2012    Family History  Problem Relation Age of Onset   Thyroid disease Mother    Hypertension Mother    Hyperlipidemia Father    Thyroid disease Maternal Grandmother    Cancer Maternal Grandmother        BREAST, NOW CANCER FREE   Dementia Maternal Grandmother    Breast cancer Maternal Grandmother        Late 95's  Cancer Maternal Grandfather        LUNG   Hypertension Maternal Grandfather        AORTIC ANEURYSM REPAIR   Cancer Paternal Grandfather        LUNG    Cancer Paternal Aunt        BLADDER   Cancer Paternal Uncle        PANCREATIC    Social History   Socioeconomic History   Marital status: Divorced    Spouse name: Not on file   Number of children: Not on file   Years of education: Not on file   Highest education level: Associate degree: occupational, Scientist, product/process development, or vocational program  Occupational History   Not on file  Tobacco Use   Smoking status: Never   Smokeless tobacco: Never  Vaping Use   Vaping status:  Never Used  Substance and Sexual Activity   Alcohol use: No   Drug use: No   Sexual activity: Yes    Birth control/protection: Condom  Other Topics Concern   Not on file  Social History Narrative   Not on file   Social Drivers of Health   Financial Resource Strain: Low Risk  (11/27/2023)   Overall Financial Resource Strain (CARDIA)    Difficulty of Paying Living Expenses: Not hard at all  Food Insecurity: No Food Insecurity (11/27/2023)   Hunger Vital Sign    Worried About Running Out of Food in the Last Year: Never true    Ran Out of Food in the Last Year: Never true  Transportation Needs: No Transportation Needs (11/27/2023)   PRAPARE - Administrator, Civil Service (Medical): No    Lack of Transportation (Non-Medical): No  Physical Activity: Insufficiently Active (11/27/2023)   Exercise Vital Sign    Days of Exercise per Week: 2 days    Minutes of Exercise per Session: 20 min  Stress: No Stress Concern Present (11/27/2023)   [employer] of Occupational Health - Occupational Stress Questionnaire    Feeling of Stress : Only a little  Recent Concern: Stress - Stress Concern Present (10/02/2023)   [employer] of Occupational Health - Occupational Stress Questionnaire    Feeling of Stress : To some extent  Social Connections: Socially Isolated (11/27/2023)   Social Connection and Isolation Panel [NHANES]    Frequency of Communication with Friends and Family: More than three times a week    Frequency of Social Gatherings with Friends and Family: Twice a week    Attends Religious Services: Never    Database administrator or Organizations: No    Attends Engineer, structural: Not on file    Marital Status: Divorced  Catering manager Violence: Not on file    Review of Systems: See HPI, otherwise negative ROS  Physical Exam: BP 122/81   Pulse 68   Temp (!) 97.3 F (36.3 C) (Temporal)   Resp 14   Wt 112.9 kg   LMP 07/24/2015   SpO2 100%   BMI  42.08 kg/m  General:   Alert,  pleasant and cooperative in NAD Head:  Normocephalic and atraumatic. Neck:  Supple; no masses or thyromegaly. Lungs:  Clear throughout to auscultation.    Heart:  Regular rate and rhythm. Abdomen:  Soft, nontender and nondistended. Normal bowel sounds, without guarding, and without rebound.   Neurologic:  Alert and  oriented x4;  grossly normal neurologically.  Impression/Plan: Blanca Friend is here for an colonoscopy to be performed for a history  of adenomatous polyps on 2019   Risks, benefits, limitations, and alternatives regarding  colonoscopy have been reviewed with the patient.  Questions have been answered.  All parties agreeable.   Midge Minium, MD  01/10/2024, 10:40 AM

## 2024-01-10 NOTE — Transfer of Care (Signed)
Immediate Anesthesia Transfer of Care Note  Patient: Laura Gray  Procedure(s) Performed: COLONOSCOPY POLYPECTOMY  Patient Location: PACU  Anesthesia Type:General  Level of Consciousness: awake and alert   Airway & Oxygen Therapy: Patient Spontanous Breathing and Patient connected to nasal cannula oxygen  Post-op Assessment: Report given to RN and Post -op Vital signs reviewed and stable  Post vital signs: Reviewed and stable  Last Vitals:  Vitals Value Taken Time  BP    Temp    Pulse    Resp    SpO2      Last Pain:  Vitals:   01/10/24 1022  TempSrc: Temporal  PainSc: 0-No pain         Complications: No notable events documented.

## 2024-01-10 NOTE — Op Note (Signed)
[facility] Gastroenterology Patient Name: Laura Gray Procedure Date: 01/10/2024 10:46 AM MRN: [id number] Account #: [id number] Date of Birth: Dec 08, 1966 Admit Type: Outpatient Age: 58 Room: [facility] ENDO ROOM 4 Gender: Female Note Status: Finalized Instrument Name: Prentice Docker [id number] Procedure:             Colonoscopy Indications:           High risk colon cancer surveillance: Personal history                         of colonic polyps Providers:             Midge Minium MD, MD Referring MD:          Duncan Dull, MD (Referring MD) Medicines:             Propofol per Anesthesia Complications:         No immediate complications. Procedure:             Pre-Anesthesia Assessment:                        - Prior to the procedure, a History and Physical was                         performed, and patient medications and allergies were                         reviewed. The patient's tolerance of previous                         anesthesia was also reviewed. The risks and benefits                         of the procedure and the sedation options and risks                         were discussed with the patient. All questions were                         answered, and informed consent was obtained. Prior                         Anticoagulants: The patient has taken no anticoagulant                         or antiplatelet agents. ASA Grade Assessment: II - A                         patient with mild systemic disease. After reviewing                         the risks and benefits, the patient was deemed in                         satisfactory condition to undergo the procedure.                        After obtaining informed consent, the colonoscope was  passed under direct vision. Throughout the procedure,                         the patient's blood pressure, pulse, and oxygen                         saturations were monitored continuously. The                          Colonoscope was introduced through the anus and                         advanced to the the cecum, identified by appendiceal                         orifice and ileocecal valve. The colonoscopy was                         performed without difficulty. The patient tolerated                         the procedure well. The quality of the bowel                         preparation was good. Findings:      The perianal and digital rectal examinations were normal.      A 5 mm polyp was found in the cecum. The polyp was sessile. The polyp       was removed with a cold snare. Resection was complete, but the polyp       tissue was not retrieved. Impression:            - One 5 mm polyp in the cecum, removed with a cold                         snare. Complete resection. Polyp tissue not retrieved. Recommendation:        - Discharge patient to home.                        - Resume previous diet.                        - Continue present medications.                        - Repeat colonoscopy in 7 years for surveillance. Procedure Code(s):     --- Professional ---                        [phone], Colonoscopy, flexible; with removal of                         tumor(s), polyp(s), or other lesion(s) by snare                         technique Diagnosis Code(s):     --- Professional ---                        Z86.010, Personal history of colonic polyps  D12.0, Benign neoplasm of cecum CPT copyright 2022 American Medical Association. All rights reserved. The codes documented in this report are preliminary and upon coder review may  be revised to meet current compliance requirements. Midge Minium MD, MD 01/10/2024 11:06:38 AM This report has been signed electronically. Number of Addenda: 0 Note Initiated On: 01/10/2024 10:46 AM Scope Withdrawal Time: 0 hours 10 minutes 14 seconds  Total Procedure Duration: 0 hours 12 minutes 33 seconds  Estimated Blood Loss:  Estimated  blood loss: none.      [facility]

## 2024-01-11 ENCOUNTER — Encounter: Payer: Self-pay | Admitting: Gastroenterology

## 2024-01-27 ENCOUNTER — Other Ambulatory Visit: Payer: Self-pay

## 2024-01-29 ENCOUNTER — Other Ambulatory Visit: Payer: Self-pay

## 2024-02-23 ENCOUNTER — Other Ambulatory Visit: Payer: Self-pay | Admitting: Internal Medicine

## 2024-02-23 DIAGNOSIS — E1169 Type 2 diabetes mellitus with other specified complication: Secondary | ICD-10-CM

## 2024-02-24 ENCOUNTER — Other Ambulatory Visit: Payer: Self-pay

## 2024-02-25 ENCOUNTER — Other Ambulatory Visit: Payer: Self-pay

## 2024-02-25 ENCOUNTER — Other Ambulatory Visit: Payer: Self-pay | Admitting: Internal Medicine

## 2024-02-25 DIAGNOSIS — E1169 Type 2 diabetes mellitus with other specified complication: Secondary | ICD-10-CM

## 2024-02-26 ENCOUNTER — Other Ambulatory Visit: Payer: Self-pay

## 2024-02-26 MED FILL — Semaglutide Soln Pen-inj 2 MG/DOSE (8 MG/3ML): SUBCUTANEOUS | 28 days supply | Qty: 3 | Fill #0 | Status: AC

## 2024-03-21 MED FILL — Semaglutide Soln Pen-inj 2 MG/DOSE (8 MG/3ML): SUBCUTANEOUS | 28 days supply | Qty: 3 | Fill #1 | Status: AC

## 2024-03-23 ENCOUNTER — Other Ambulatory Visit: Payer: Self-pay

## 2024-03-26 LAB — HM DIABETES EYE EXAM

## 2024-04-18 MED FILL — Semaglutide Soln Pen-inj 2 MG/DOSE (8 MG/3ML): SUBCUTANEOUS | 28 days supply | Qty: 3 | Fill #2 | Status: AC

## 2024-04-19 ENCOUNTER — Other Ambulatory Visit: Payer: Self-pay

## 2024-04-22 ENCOUNTER — Other Ambulatory Visit: Payer: Self-pay

## 2024-05-18 ENCOUNTER — Other Ambulatory Visit: Payer: Self-pay

## 2024-05-26 ENCOUNTER — Telehealth: Payer: Self-pay

## 2024-05-26 ENCOUNTER — Other Ambulatory Visit (HOSPITAL_COMMUNITY): Payer: Self-pay

## 2024-05-26 NOTE — Telephone Encounter (Signed)
 Pharmacy Patient Advocate Encounter   Received notification from CoverMyMeds that prior authorization for Ozempic  8 is required/requested.   Insurance verification completed.   The patient is insured through [facility] .   Per test claim: The current 28 day co-pay is, $24.99.  No PA needed at this time. This test claim was processed through [facility]- copay amounts may vary at other pharmacies due to pharmacy/plan contracts, or as the patient moves through the different stages of their insurance plan.

## 2024-05-27 ENCOUNTER — Other Ambulatory Visit (HOSPITAL_COMMUNITY): Payer: Self-pay

## 2024-05-27 ENCOUNTER — Other Ambulatory Visit: Payer: Self-pay | Admitting: Internal Medicine

## 2024-05-27 DIAGNOSIS — R946 Abnormal results of thyroid function studies: Secondary | ICD-10-CM | POA: Diagnosis not present

## 2024-05-27 DIAGNOSIS — E1169 Type 2 diabetes mellitus with other specified complication: Secondary | ICD-10-CM

## 2024-05-29 ENCOUNTER — Encounter: Payer: Self-pay | Admitting: Internal Medicine

## 2024-05-29 ENCOUNTER — Ambulatory Visit: Payer: Commercial Managed Care - PPO | Admitting: Internal Medicine

## 2024-05-29 ENCOUNTER — Other Ambulatory Visit: Payer: Self-pay

## 2024-05-29 VITALS — BP 132/84 | HR 83 | Temp 98.1°F | Ht 64.5 in | Wt 249.8 lb

## 2024-05-29 DIAGNOSIS — Z7985 Long-term (current) use of injectable non-insulin antidiabetic drugs: Secondary | ICD-10-CM | POA: Diagnosis not present

## 2024-05-29 DIAGNOSIS — E1159 Type 2 diabetes mellitus with other circulatory complications: Secondary | ICD-10-CM | POA: Diagnosis not present

## 2024-05-29 DIAGNOSIS — I152 Hypertension secondary to endocrine disorders: Secondary | ICD-10-CM | POA: Diagnosis not present

## 2024-05-29 DIAGNOSIS — E669 Obesity, unspecified: Secondary | ICD-10-CM | POA: Diagnosis not present

## 2024-05-29 DIAGNOSIS — E1169 Type 2 diabetes mellitus with other specified complication: Secondary | ICD-10-CM | POA: Diagnosis not present

## 2024-05-29 DIAGNOSIS — Z7984 Long term (current) use of oral hypoglycemic drugs: Secondary | ICD-10-CM | POA: Diagnosis not present

## 2024-05-29 LAB — HEMOGLOBIN A1C: Hgb A1c MFr Bld: 4.9 % (ref 4.6–6.5)

## 2024-05-29 MED ORDER — FREESTYLE LIBRE 3 PLUS SENSOR MISC
11 refills | Status: DC
  Filled 2024-05-29: qty 2, 30d supply, fill #0
  Filled 2024-07-12: qty 2, 30d supply, fill #1
  Filled 2024-08-11: qty 2, 30d supply, fill #2
  Filled 2024-09-09: qty 2, 30d supply, fill #3
  Filled 2024-10-09: qty 2, 30d supply, fill #4
  Filled 2024-11-06: qty 2, 30d supply, fill #5

## 2024-05-29 MED ORDER — OZEMPIC (2 MG/DOSE) 8 MG/3ML ~~LOC~~ SOPN
2.0000 mg | PEN_INJECTOR | SUBCUTANEOUS | 2 refills | Status: DC
  Filled 2024-05-29: qty 3, 28d supply, fill #0
  Filled 2024-06-21: qty 3, 28d supply, fill #1
  Filled 2024-07-12 – 2024-07-14 (×2): qty 3, 28d supply, fill #2

## 2024-05-29 MED ORDER — FLUOXETINE HCL 40 MG PO CAPS
40.0000 mg | ORAL_CAPSULE | Freq: Every day | ORAL | 1 refills | Status: DC
  Filled 2024-05-29: qty 90, 90d supply, fill #0
  Filled 2024-08-25: qty 90, 90d supply, fill #1

## 2024-05-29 MED ORDER — TETANUS-DIPHTH-ACELL PERTUSSIS 5-2.5-18.5 LF-MCG/0.5 IM SUSY
0.5000 mL | PREFILLED_SYRINGE | Freq: Once | INTRAMUSCULAR | 0 refills | Status: AC
  Filled 2024-05-29: qty 0.5, 1d supply, fill #0

## 2024-05-29 MED ORDER — ROSUVASTATIN CALCIUM 5 MG PO TABS
5.0000 mg | ORAL_TABLET | Freq: Every day | ORAL | 1 refills | Status: DC
  Filled 2024-05-29: qty 90, 90d supply, fill #0
  Filled 2024-08-25: qty 90, 90d supply, fill #1

## 2024-05-29 MED ORDER — METFORMIN HCL ER 500 MG PO TB24
500.0000 mg | ORAL_TABLET | Freq: Every day | ORAL | 1 refills | Status: DC
  Filled 2024-05-29: qty 90, 90d supply, fill #0
  Filled 2024-08-25: qty 90, 90d supply, fill #1

## 2024-05-29 MED ORDER — TETANUS-DIPHTH-ACELL PERTUSSIS 5-2.5-18.5 LF-MCG/0.5 IM SUSY: 0.5000 mL | PREFILLED_SYRINGE | Freq: Once | INTRAMUSCULAR | 0 refills | Status: DC

## 2024-05-29 MED ORDER — TELMISARTAN 20 MG PO TABS
20.0000 mg | ORAL_TABLET | Freq: Every day | ORAL | 1 refills | Status: DC
  Filled 2024-05-29: qty 90, 90d supply, fill #0
  Filled 2024-08-25: qty 90, 90d supply, fill #1

## 2024-05-29 NOTE — Progress Notes (Signed)
 Subjective:  Patient ID: Laura Gray, female    DOB: [birth year]  Age: 58 y.o. MRN: [id number]  CC: The primary encounter diagnosis was Obesity, diabetes, and hypertension syndrome (HCC). A diagnosis of Type 2 diabetes mellitus with other specified complication [facility]) was also pertinent to this visit.   HPI Kamali L Utt presents for  Chief Complaint  Patient presents with   Medical Management of Chronic Issues    Discuss Libre 3 Plus   1) obesity/diabetes/hypertension:  last seen in December . She has a net loss of 41 lbs and A1c has dropped to < 5.0 on Ozempic  and metformin  . I have downloaded and reviewed the data from patient's continuous blood glucose monitor  for  the period  of  May 23 to June 5 . Patient's  sugars have been  IN RANGE  99   % OF THE TIME,   ABOVE RANGE  0  % of the time.  And  BELOW  RANGE 1  % OF THE TIME .  Medications currently used for glycemic control are   Ozempic  2 mg weekly and metformin  500 mg daily.  Patient reports compliance with medication approximately 100   % of the time      Outpatient Medications Prior to Visit  Medication Sig Dispense Refill   busPIRone  (BUSPAR ) 30 MG tablet Take 1 tablet (30 mg total) by mouth 2 (two) times daily. 180 tablet 3   cetirizine (ZYRTEC) 10 MG tablet Take 10 mg by mouth as needed.     Continuous Blood Gluc Receiver (FREESTYLE LIBRE 3 READER) DEVI 1 Device by Does not apply route every 14 (fourteen) days. TO CHECK SUGARS 6 TIMES DAILY (PRE AND 2 HR POST MEALS) 2 each 11   Melatonin 1 MG CAPS Take 1 capsule by mouth at bedtime as needed.     ondansetron  (ZOFRAN -ODT) 4 MG disintegrating tablet Dissolve 1 tablet (4 mg total) by mouth every 8 (eight) hours as needed for nausea or vomiting. 20 tablet 0   Continuous Glucose Sensor (FREESTYLE LIBRE 3 SENSOR) MISC Place 1 sensor on the skin every 14 days. Use to check glucose continuously 2 each 11   FLUoxetine  (PROZAC ) 40 MG capsule Take 1 capsule (40 mg total) by mouth  daily. 90 capsule 1   metFORMIN  (GLUCOPHAGE -XR) 500 MG 24 hr tablet Take 1 tablet (500 mg total) by mouth daily with breakfast. 90 tablet 1   rosuvastatin  (CRESTOR ) 5 MG tablet Take 1 tablet (5 mg total) by mouth daily. 90 tablet 1   Semaglutide , 2 MG/DOSE, (OZEMPIC , 2 MG/DOSE,) 8 MG/3ML SOPN Inject 2 mg into the skin once a week. 3 mL 2   telmisartan  (MICARDIS ) 20 MG tablet Take 1 tablet (20 mg total) by mouth at bedtime. 90 tablet 1   No facility-administered medications prior to visit.    Review of Systems;  Patient denies headache, fevers, malaise, unintentional weight loss, skin rash, eye pain, sinus congestion and sinus pain, sore throat, dysphagia,  hemoptysis , cough, dyspnea, wheezing, chest pain, palpitations, orthopnea, edema, abdominal pain, nausea, melena, diarrhea, constipation, flank pain, dysuria, hematuria, urinary  Frequency, nocturia, numbness, tingling, seizures,  Focal weakness, Loss of consciousness,  Tremor, insomnia, depression, anxiety, and suicidal ideation.      Objective:  BP 132/84   Pulse 83   Temp 98.1 F (36.7 C)   Ht 5' 4.5" (1.638 m)   Wt 249 lb 12.8 oz (113.3 kg)   LMP 07/24/2015   SpO2 98%  BMI 42.22 kg/m   BP Readings from Last 3 Encounters:  05/29/24 132/84  01/10/24 93/62  01/09/24 128/76    Wt Readings from Last 3 Encounters:  05/29/24 249 lb 12.8 oz (113.3 kg)  01/10/24 249 lb (112.9 kg)  01/09/24 252 lb 11.4 oz (114.6 kg)    Physical Exam Vitals reviewed.  Constitutional:      General: She is not in acute distress.    Appearance: Normal appearance. She is normal weight. She is not ill-appearing, toxic-appearing or diaphoretic.  HENT:     Head: Normocephalic.  Eyes:     General: No scleral icterus.       Right eye: No discharge.        Left eye: No discharge.     Conjunctiva/sclera: Conjunctivae normal.  Cardiovascular:     Rate and Rhythm: Normal rate and regular rhythm.     Heart sounds: Normal heart sounds.  Pulmonary:      Effort: Pulmonary effort is normal. No respiratory distress.     Breath sounds: Normal breath sounds.  Musculoskeletal:        General: Normal range of motion.  Skin:    General: Skin is warm and dry.  Neurological:     General: No focal deficit present.     Mental Status: She is alert and oriented to person, place, and time. Mental status is at baseline.  Psychiatric:        Mood and Affect: Mood normal.        Behavior: Behavior normal.        Thought Content: Thought content normal.        Judgment: Judgment normal.    Lab Results  Component Value Date   HGBA1C 4.9 05/29/2024   HGBA1C 4.9 11/26/2023   HGBA1C 4.8 07/11/2023    Lab Results  Component Value Date   CREATININE 0.98 05/29/2024   CREATININE 0.94 11/26/2023   CREATININE 0.87 10/04/2023    Lab Results  Component Value Date   WBC 9.2 10/04/2023   HGB 14.5 10/04/2023   HCT 43.3 10/04/2023   PLT 289.0 10/04/2023   GLUCOSE 88 05/29/2024   CHOL 163 05/29/2024   TRIG 112 05/29/2024   HDL 67 05/29/2024   LDLDIRECT 69.0 05/30/2023   LDLCALC 76 05/29/2024   ALT 19 05/29/2024   AST 18 05/29/2024   NA 136 05/29/2024   K 4.1 05/29/2024   CL 103 05/29/2024   CREATININE 0.98 05/29/2024   BUN 12 05/29/2024   CO2 22 05/29/2024   TSH 0.29 (L) 10/11/2023   HGBA1C 4.9 05/29/2024   MICROALBUR 2.0 (H) 05/29/2024    No results found.  Assessment & Plan:  .Obesity, diabetes, and hypertension syndrome (HCC) Assessment & Plan: Well controlled,  with weight loss of 41 lb   since 2022  using Ozempic  and  metformin .  Continue crestor ,  telmisartan . During prior visit she reported  upper abd pain and 2 week history of  RUQ pain which WAS worrisome for gallstone pancreatitis .However,   Lfts  lipase and plain abd films were all normal. And pain has resolved without recurrence.  No Medication changes were made based on this review   Continue metformin  and oempic  .  Advised to drink 4 ounces of milk or protein shake  before she lies down to avoid hypoglycemic events.   Lab Results  Component Value Date   HGBA1C 4.9 05/29/2024   Lab Results  Component Value Date   MICROALBUR 2.0 (H) 05/29/2024  MICROALBUR 1.7 10/11/2023       Type 2 diabetes mellitus with other specified complication (HCC) -     metFORMIN  HCl ER; Take 1 tablet (500 mg total) by mouth daily with breakfast.  Dispense: 90 tablet; Refill: 1 -     Rosuvastatin  Calcium ; Take 1 tablet (5 mg total) by mouth daily.  Dispense: 90 tablet; Refill: 1 -     Ozempic  (2 MG/DOSE); Inject 2 mg into the skin once a week.  Dispense: 3 mL; Refill: 2 -     Telmisartan ; Take 1 tablet (20 mg total) by mouth at bedtime.  Dispense: 90 tablet; Refill: 1 -     Microalbumin / creatinine urine ratio -     Hemoglobin A1c -     Comprehensive metabolic panel with GFR -     Lipid Panel w/reflex Direct LDL  Other orders -     FLUoxetine  HCl; Take 1 capsule (40 mg total) by mouth daily.  Dispense: 90 capsule; Refill: 1 -     FreeStyle Libre 3 Plus Sensor; Change sensor every 15 days.  Dispense: 2 each; Refill: 11 -     Tetanus-Diphth-Acell Pertussis; Inject 0.5 mLs into the muscle once for 1 dose.  Dispense: 0.5 mL; Refill: 0     Follow-up: Return in about 6 months (around 11/28/2024) for physical.   Thersia Flax, MD

## 2024-05-29 NOTE — Patient Instructions (Signed)
 You are due for tdap vaccine  in July.  I have sent the rx to [facility] pharmacy  No changes today  See you in 6 months

## 2024-05-30 LAB — LIPID PANEL W/REFLEX DIRECT LDL
Cholesterol: 163 mg/dL (ref <200)
HDL: 67 mg/dL (ref >=50)
LDL Cholesterol (Calc): 76 mg/dL
Non-HDL Cholesterol (Calc): 96 mg/dL (ref <130)
Total CHOL/HDL Ratio: 2.4 (calc) (ref <5.0)
Triglycerides: 112 mg/dL (ref <150)

## 2024-05-30 LAB — COMPREHENSIVE METABOLIC PANEL WITH GFR
ALT: 19 U/L (ref 0–35)
AST: 18 U/L (ref 0–37)
Albumin: 4.1 g/dL (ref 3.5–5.2)
Alkaline Phosphatase: 65 U/L (ref 39–117)
BUN: 12 mg/dL (ref 6–23)
CO2: 22 meq/L (ref 19–32)
Calcium: 9.4 mg/dL (ref 8.4–10.5)
Chloride: 103 meq/L (ref 96–112)
Creatinine, Ser: 0.98 mg/dL (ref 0.40–1.20)
GFR: 63.84 mL/min (ref >60.00)
Glucose, Bld: 88 mg/dL (ref 70–99)
Potassium: 4.1 meq/L (ref 3.5–5.1)
Sodium: 136 meq/L (ref 135–145)
Total Bilirubin: 0.6 mg/dL (ref 0.2–1.2)
Total Protein: 7.3 g/dL (ref 6.0–8.3)

## 2024-05-30 LAB — MICROALBUMIN / CREATININE URINE RATIO
Creatinine,U: 321.4 mg/dL
Microalb Creat Ratio: 6.3 mg/g (ref 0.0–30.0)
Microalb, Ur: 2 mg/dL — ABNORMAL HIGH (ref 0.0–1.9)

## 2024-05-31 ENCOUNTER — Ambulatory Visit: Payer: Self-pay | Admitting: Internal Medicine

## 2024-06-01 NOTE — Assessment & Plan Note (Signed)
 Well controlled,  with weight loss of 41 lb   since 2022  using Ozempic  and  metformin .  Continue crestor ,  telmisartan . During prior visit she reported  upper abd pain and 2 week history of  RUQ pain which WAS worrisome for gallstone pancreatitis .However,   Lfts  lipase and plain abd films were all normal. And pain has resolved without recurrence.  No Medication changes were made based on this review   Continue metformin  and oempic  .  Advised to drink 4 ounces of milk or protein shake before she lies down to avoid hypoglycemic events.   Lab Results  Component Value Date   HGBA1C 4.9 05/29/2024   Lab Results  Component Value Date   MICROALBUR 2.0 (H) 05/29/2024   MICROALBUR 1.7 10/11/2023

## 2024-06-03 DIAGNOSIS — R7989 Other specified abnormal findings of blood chemistry: Secondary | ICD-10-CM | POA: Diagnosis not present

## 2024-07-13 ENCOUNTER — Other Ambulatory Visit: Payer: Self-pay

## 2024-08-11 ENCOUNTER — Other Ambulatory Visit: Payer: Self-pay

## 2024-08-11 ENCOUNTER — Other Ambulatory Visit: Payer: Self-pay | Admitting: Internal Medicine

## 2024-08-11 DIAGNOSIS — E1169 Type 2 diabetes mellitus with other specified complication: Secondary | ICD-10-CM

## 2024-08-11 MED ORDER — OZEMPIC (2 MG/DOSE) 8 MG/3ML ~~LOC~~ SOPN
2.0000 mg | PEN_INJECTOR | SUBCUTANEOUS | 2 refills | Status: DC
  Filled 2024-08-11: qty 3, 28d supply, fill #0
  Filled 2024-09-09: qty 3, 28d supply, fill #1
  Filled 2024-10-09: qty 3, 28d supply, fill #2

## 2024-09-09 ENCOUNTER — Other Ambulatory Visit: Payer: Self-pay

## 2024-10-09 ENCOUNTER — Other Ambulatory Visit: Payer: Self-pay

## 2024-11-06 ENCOUNTER — Other Ambulatory Visit: Payer: Self-pay | Admitting: Internal Medicine

## 2024-11-06 ENCOUNTER — Other Ambulatory Visit: Payer: Self-pay

## 2024-11-06 DIAGNOSIS — E1169 Type 2 diabetes mellitus with other specified complication: Secondary | ICD-10-CM

## 2024-11-06 MED ORDER — OZEMPIC (2 MG/DOSE) 8 MG/3ML ~~LOC~~ SOPN
2.0000 mg | PEN_INJECTOR | SUBCUTANEOUS | 2 refills | Status: DC
  Filled 2024-11-06: qty 3, 28d supply, fill #0

## 2024-12-03 ENCOUNTER — Other Ambulatory Visit (HOSPITAL_COMMUNITY)
Admission: RE | Admit: 2024-12-03 | Discharge: 2024-12-03 | Disposition: A | Discharge status: Home / Self Care | Source: Ambulatory Visit | Attending: Internal Medicine | Admitting: Internal Medicine

## 2024-12-03 ENCOUNTER — Encounter: Payer: Self-pay | Admitting: Internal Medicine

## 2024-12-03 ENCOUNTER — Other Ambulatory Visit: Payer: Self-pay

## 2024-12-03 ENCOUNTER — Ambulatory Visit: Admitting: Internal Medicine

## 2024-12-03 VITALS — BP 102/64 | HR 71 | Ht 64.5 in | Wt 254.2 lb

## 2024-12-03 DIAGNOSIS — I10 Essential (primary) hypertension: Secondary | ICD-10-CM | POA: Diagnosis not present

## 2024-12-03 DIAGNOSIS — E038 Other specified hypothyroidism: Secondary | ICD-10-CM

## 2024-12-03 DIAGNOSIS — E1169 Type 2 diabetes mellitus with other specified complication: Secondary | ICD-10-CM

## 2024-12-03 DIAGNOSIS — E669 Obesity, unspecified: Secondary | ICD-10-CM

## 2024-12-03 DIAGNOSIS — R5383 Other fatigue: Secondary | ICD-10-CM | POA: Diagnosis not present

## 2024-12-03 DIAGNOSIS — Z7985 Long-term (current) use of injectable non-insulin antidiabetic drugs: Secondary | ICD-10-CM | POA: Diagnosis not present

## 2024-12-03 DIAGNOSIS — Z91B Personal risk factor of exposure to diethylstilbestrol: Secondary | ICD-10-CM | POA: Diagnosis not present

## 2024-12-03 DIAGNOSIS — E785 Hyperlipidemia, unspecified: Secondary | ICD-10-CM

## 2024-12-03 DIAGNOSIS — Z124 Encounter for screening for malignant neoplasm of cervix: Secondary | ICD-10-CM | POA: Diagnosis not present

## 2024-12-03 DIAGNOSIS — Z0000 Encounter for general adult medical examination without abnormal findings: Secondary | ICD-10-CM | POA: Diagnosis not present

## 2024-12-03 DIAGNOSIS — E119 Type 2 diabetes mellitus without complications: Secondary | ICD-10-CM

## 2024-12-03 DIAGNOSIS — Z1231 Encounter for screening mammogram for malignant neoplasm of breast: Secondary | ICD-10-CM

## 2024-12-03 MED ORDER — OZEMPIC (2 MG/DOSE) 8 MG/3ML ~~LOC~~ SOPN
2.0000 mg | PEN_INJECTOR | SUBCUTANEOUS | 1 refills | Status: AC
  Filled 2024-12-03: qty 9, 84d supply, fill #0

## 2024-12-03 MED ORDER — METFORMIN HCL ER 500 MG PO TB24
500.0000 mg | ORAL_TABLET | Freq: Every day | ORAL | 3 refills | Status: AC
  Filled 2024-12-03: qty 90, 90d supply, fill #0

## 2024-12-03 MED ORDER — FREESTYLE LIBRE 3 PLUS SENSOR MISC
3 refills | Status: AC
  Filled 2024-12-03: qty 6, 90d supply, fill #0

## 2024-12-03 MED ORDER — FLUOXETINE HCL 40 MG PO CAPS
40.0000 mg | ORAL_CAPSULE | Freq: Every day | ORAL | 3 refills | Status: AC
  Filled 2024-12-03: qty 90, 90d supply, fill #0

## 2024-12-03 MED ORDER — ROSUVASTATIN CALCIUM 5 MG PO TABS
5.0000 mg | ORAL_TABLET | Freq: Every day | ORAL | 3 refills | Status: AC
  Filled 2024-12-03: qty 90, 90d supply, fill #0

## 2024-12-03 MED ORDER — TELMISARTAN 20 MG PO TABS
20.0000 mg | ORAL_TABLET | Freq: Every day | ORAL | 3 refills | Status: AC
  Filled 2024-12-03: qty 90, 90d supply, fill #0

## 2024-12-03 NOTE — Progress Notes (Signed)
 Patient ID: Laura Gray, female    DOB: [birth year]  Age: 58 y.o. MRN: [id number]  The patient is here for annual preventive examination and management of other chronic and acute problems.   The risk factors are reflected in the social history.   The roster of all physicians providing medical care to patient - is listed in the Snapshot section of the chart.   Activities of daily living:  The patient is 100% independent in all ADLs: dressing, toileting, feeding as well as independent mobility   Home safety : The patient has smoke detectors in the home. They wear seatbelts.  There are no unsecured firearms at home. There is no violence in the home.    There is no risks for hepatitis, STDs or HIV. There is no   history of blood transfusion. They have no travel history to infectious disease endemic areas of the world.   The patient has seen their dentist in the last six month. They have seen their eye doctor in the last year. The patinet  denies slight hearing difficulty with regard to whispered voices and some television programs.  They have deferred audiologic testing in the last year.  They do not  have excessive sun exposure. Discussed the need for sun protection: hats, long sleeves and use of sunscreen if there is significant sun exposure.    Diet: the importance of a healthy diet is discussed. They do have a healthy diet.   The benefits of regular aerobic exercise were discussed. The patient  exercises  3 to 5 days per week  for  60 minutes.    Depression screen: there are no signs or vegative symptoms of depression- irritability, change in appetite, anhedonia, sadness/tearfullness.   The following portions of the patient's history were reviewed and updated as appropriate: allergies, current medications, past family history, past medical history,  past surgical history, past social history  and problem list.   Visual acuity was not assessed per patient preference since the patient has  regular follow up with an  ophthalmologist. Hearing and body mass index were assessed and reviewed.    During the course of the visit the patient was educated and counseled about appropriate screening and preventive services including : fall prevention , diabetes screening, nutrition counseling, colorectal cancer screening, and recommended immunizations.    Chief Complaint:   1) WEIGHT GAIN DUE TO SPENDING 4 DAYS AT [facility] getting cardiac cath , 3 stents in the LA and acute PE>  currently on asa,  plavix, and pradaxa  for 30 days.   Dad is doing well post procedure  2) obesity : using a meal prep plan to eat healthier.  Ozempic  2 mg weekly   3)   Review of Symptoms  Patient denies headache, fevers, malaise, unintentional weight loss, skin rash, eye pain, sinus congestion and sinus pain, sore throat, dysphagia,  hemoptysis , cough, dyspnea, wheezing, chest pain, palpitations, orthopnea, edema, abdominal pain, nausea, melena, diarrhea, constipation, flank pain, dysuria, hematuria, urinary  Frequency, nocturia, numbness, tingling, seizures,  Focal weakness, Loss of consciousness,  Tremor, insomnia, depression, anxiety, and suicidal ideation.    Physical Exam:  BP 102/64   Pulse 71   Ht 5' 4.5 (1.638 m)   Wt 254 lb 3.2 oz (115.3 kg)   LMP 07/24/2015   SpO2 97%   BMI 42.96 kg/m    Physical Exam Vitals reviewed.  Constitutional:      General: She is not in acute distress.  Appearance: Normal appearance. She is well-developed and normal weight. She is not ill-appearing, toxic-appearing or diaphoretic.  HENT:     Head: Normocephalic.     Right Ear: Tympanic membrane, ear canal and external ear normal. There is no impacted cerumen.     Left Ear: Tympanic membrane, ear canal and external ear normal. There is no impacted cerumen.     Nose: Nose normal.     Mouth/Throat:     Mouth: Mucous membranes are moist.     Pharynx: Oropharynx is clear.  Eyes:     General: No scleral icterus.        Right eye: No discharge.        Left eye: No discharge.     Conjunctiva/sclera: Conjunctivae normal.     Pupils: Pupils are equal, round, and reactive to light.  Neck:     Thyroid : No thyromegaly.     Vascular: No carotid bruit or JVD.  Cardiovascular:     Rate and Rhythm: Normal rate and regular rhythm.     Heart sounds: Normal heart sounds.  Pulmonary:     Effort: Pulmonary effort is normal. No respiratory distress.     Breath sounds: Normal breath sounds.  Chest:  Breasts:    Breasts are symmetrical.     Right: Normal. No swelling, inverted nipple, mass, nipple discharge, skin change or tenderness.     Left: Normal. No swelling, inverted nipple, mass, nipple discharge, skin change or tenderness.  Abdominal:     General: Bowel sounds are normal.     Palpations: Abdomen is soft. There is no mass.     Tenderness: There is no abdominal tenderness. There is no guarding or rebound.     Hernia: There is no hernia in the left inguinal area or right inguinal area.  Genitourinary:    Exam position: Lithotomy position.     Pubic Area: No rash or pubic lice.      Labia:        Right: No rash, tenderness, lesion or injury.        Left: No rash, tenderness, lesion or injury.      Vagina: Normal.     Cervix: Normal.     Uterus: Normal.      Adnexa: Right adnexa normal and left adnexa normal.  Musculoskeletal:        General: Normal range of motion.     Cervical back: Normal range of motion and neck supple.  Lymphadenopathy:     Cervical: No cervical adenopathy.     Upper Body:     Right upper body: No supraclavicular, axillary or pectoral adenopathy.     Left upper body: No supraclavicular, axillary or pectoral adenopathy.     Lower Body: No right inguinal adenopathy. No left inguinal adenopathy.  Skin:    General: Skin is warm and dry.  Neurological:     General: No focal deficit present.     Mental Status: She is alert and oriented to person, place, and time. Mental status is at  baseline.  Psychiatric:        Mood and Affect: Mood normal.        Behavior: Behavior normal.        Thought Content: Thought content normal.        Judgment: Judgment normal.     Assessment and Plan: Encounter for screening mammogram for malignant neoplasm of breast -     3D Screening Mammogram, Left and Right; Future  Encounter for preventive  health examination Assessment & Plan: age appropriate education and counseling updated, referrals for preventative services and immunizations addressed, dietary and smoking counseling addressed, most recent labs reviewed.  I have personally reviewed and have noted:   1) the patient's medical and social history 2) The pt's use of alcohol, tobacco, and illicit drugs 3) The patient's current medications and supplements 4) Functional ability including ADL's, fall risk, home safety risk, hearing and visual impairment 5) Diet and physical activities 6) Evidence for depression or mood disorder 7) The patient's height, weight, and BMI have been recorded in the chart    I have made referrals, and provided counseling and education based on review of the above    Type 2 diabetes mellitus with other specified complication (HCC) -     metFORMIN  HCl ER; Take 1 tablet (500 mg total) by mouth daily with breakfast.  Dispense: 90 tablet; Refill: 3 -     Rosuvastatin  Calcium ; Take 1 tablet (5 mg total) by mouth daily.  Dispense: 90 tablet; Refill: 3 -     Telmisartan ; Take 1 tablet (20 mg total) by mouth at bedtime.  Dispense: 90 tablet; Refill: 3 -     Hemoglobin A1c -     Microalbumin / creatinine urine ratio -     Comprehensive metabolic panel with GFR -     Ozempic  (2 MG/DOSE); Inject 2 mg into the skin once a week.  Dispense: 9 mL; Refill: 1  Primary hypertension -     Microalbumin / creatinine urine ratio  Hyperlipidemia, unspecified hyperlipidemia type -     Lipid panel -     LDL cholesterol, direct  Subclinical hypothyroidism -      TSH  Other fatigue -     CBC with Differential/Platelet  Cervical cancer screening -     Cytology - PAP  DES exposure in utero Assessment & Plan: She continues to have annual cervical ca screening.  PAP was done today    Obesity, diabetes, and hypertension syndrome (HCC) Assessment & Plan: Well controlled,  with weight loss of over 41 lb   since 2022  using Ozempic  and  metformin .  Continue crestor ,  telmisartan .  Lab Results  Component Value Date   HGBA1C 4.8 12/03/2024   Lab Results  Component Value Date   MICROALBUR 1.9 12/03/2024   MICROALBUR 2.0 (H) 05/29/2024       Other orders -     FLUoxetine  HCl; Take 1 capsule (40 mg total) by mouth daily.  Dispense: 90 capsule; Refill: 3 -     FreeStyle Libre 3 Plus Sensor; Change sensor every 15 days.  Dispense: 6 each; Refill: 3    No follow-ups on file.  Verneita LITTIE Kettering, MD

## 2024-12-03 NOTE — Patient Instructions (Signed)
 Try using Magnesium oxide or magnesium citrate 200-250 mg at night  if needed for constipation

## 2024-12-04 LAB — COMPREHENSIVE METABOLIC PANEL WITH GFR
ALT: 16 U/L (ref 0–35)
AST: 17 U/L (ref 0–37)
Albumin: 4.2 g/dL (ref 3.5–5.2)
Alkaline Phosphatase: 71 U/L (ref 39–117)
BUN: 11 mg/dL (ref 6–23)
CO2: 27 meq/L (ref 19–32)
Calcium: 9.6 mg/dL (ref 8.4–10.5)
Chloride: 100 meq/L (ref 96–112)
Creatinine, Ser: 0.94 mg/dL (ref 0.40–1.20)
GFR: 66.87 mL/min (ref >60.00)
Glucose, Bld: 90 mg/dL (ref 70–99)
Potassium: 4.3 meq/L (ref 3.5–5.1)
Sodium: 135 meq/L (ref 135–145)
Total Bilirubin: 0.6 mg/dL (ref 0.2–1.2)
Total Protein: 6.9 g/dL (ref 6.0–8.3)

## 2024-12-04 LAB — LIPID PANEL
Cholesterol: 136 mg/dL (ref 0–200)
HDL: 63.6 mg/dL (ref >39.00)
LDL Cholesterol: 58 mg/dL (ref 0–99)
NonHDL: 72.77
Total CHOL/HDL Ratio: 2
Triglycerides: 74 mg/dL (ref 0.0–149.0)
VLDL: 14.8 mg/dL (ref 0.0–40.0)

## 2024-12-04 LAB — CBC WITH DIFFERENTIAL/PLATELET
Basophils Absolute: 0.1 K/uL (ref 0.0–0.1)
Basophils Relative: 0.8 % (ref 0.0–3.0)
Eosinophils Absolute: 0.1 K/uL (ref 0.0–0.7)
Eosinophils Relative: 0.8 % (ref 0.0–5.0)
HCT: 40.8 % (ref 36.0–46.0)
Hemoglobin: 14.1 g/dL (ref 12.0–15.0)
Lymphocytes Relative: 22.2 % (ref 12.0–46.0)
Lymphs Abs: 1.6 K/uL (ref 0.7–4.0)
MCHC: 34.5 g/dL (ref 30.0–36.0)
MCV: 92.5 fl (ref 78.0–100.0)
Monocytes Absolute: 0.5 K/uL (ref 0.1–1.0)
Monocytes Relative: 6.4 % (ref 3.0–12.0)
Neutro Abs: 5 K/uL (ref 1.4–7.7)
Neutrophils Relative %: 69.8 % (ref 43.0–77.0)
Platelets: 253 K/uL (ref 150.0–400.0)
RBC: 4.41 Mil/uL (ref 3.87–5.11)
RDW: 12.7 % (ref 11.5–15.5)
WBC: 7.1 K/uL (ref 4.0–10.5)

## 2024-12-04 LAB — MICROALBUMIN / CREATININE URINE RATIO
Creatinine,U: 359.8 mg/dL
Microalb Creat Ratio: 5.3 mg/g (ref 0.0–30.0)
Microalb, Ur: 1.9 mg/dL (ref 0.0–1.9)

## 2024-12-04 LAB — HEMOGLOBIN A1C: Hgb A1c MFr Bld: 4.8 % (ref 4.6–6.5)

## 2024-12-04 LAB — TSH: TSH: 0.29 u[IU]/mL — ABNORMAL LOW (ref 0.35–5.50)

## 2024-12-04 LAB — LDL CHOLESTEROL, DIRECT: Direct LDL: 60 mg/dL

## 2024-12-04 NOTE — Assessment & Plan Note (Signed)
 Well controlled,  with weight loss of over 41 lb   since 2022  using Ozempic  and  metformin .  Continue crestor ,  telmisartan .  Lab Results  Component Value Date   HGBA1C 4.8 12/03/2024   Lab Results  Component Value Date   MICROALBUR 1.9 12/03/2024   MICROALBUR 2.0 (H) 05/29/2024

## 2024-12-04 NOTE — Assessment & Plan Note (Signed)
She continues to have annual cervical ca screening.  PAP was done today

## 2024-12-04 NOTE — Assessment & Plan Note (Signed)

## 2024-12-05 ENCOUNTER — Other Ambulatory Visit: Payer: Self-pay

## 2024-12-05 LAB — CYTOLOGY - PAP
Adequacy: ABSENT
Comment: NEGATIVE
Diagnosis: NEGATIVE
High risk HPV: NEGATIVE

## 2024-12-05 MED ORDER — BUSPIRONE HCL 30 MG PO TABS
30.0000 mg | ORAL_TABLET | Freq: Every day | ORAL | 5 refills | Status: AC
  Filled 2024-12-05: qty 30, 30d supply, fill #0

## 2024-12-08 ENCOUNTER — Ambulatory Visit: Payer: Self-pay | Admitting: Internal Medicine

## 2024-12-08 DIAGNOSIS — E0590 Thyrotoxicosis, unspecified without thyrotoxic crisis or storm: Secondary | ICD-10-CM

## 2024-12-08 DIAGNOSIS — I10 Essential (primary) hypertension: Secondary | ICD-10-CM

## 2024-12-08 DIAGNOSIS — E119 Type 2 diabetes mellitus without complications: Secondary | ICD-10-CM

## 2024-12-08 NOTE — Assessment & Plan Note (Signed)
 With negatve workup for autoimmmune syndroms.Continue semi annual surveillance with TSH and Free T4 per endocrinology.  Lab Results  Component Value Date   TSH 0.29 (L) 12/03/2024

## 2024-12-17 ENCOUNTER — Other Ambulatory Visit: Payer: Self-pay

## 2025-06-09 ENCOUNTER — Ambulatory Visit: Admitting: Internal Medicine
